# Patient Record
Sex: Female | Born: 1955 | Race: White | Hispanic: No | Smoking: Former smoker
Health system: Southern US, Community
[De-identification: ages and names within clinical notes are randomized; demographics above are authoritative.]

## PROBLEM LIST (undated history)

## (undated) DIAGNOSIS — M1990 Unspecified osteoarthritis, unspecified site: Secondary | ICD-10-CM

## (undated) DIAGNOSIS — I509 Heart failure, unspecified: Secondary | ICD-10-CM

## (undated) DIAGNOSIS — I10 Essential (primary) hypertension: Secondary | ICD-10-CM

## (undated) DIAGNOSIS — I639 Cerebral infarction, unspecified: Secondary | ICD-10-CM

## (undated) HISTORY — PX: REPLACEMENT TOTAL KNEE: SUR1224

## (undated) HISTORY — PX: CARPAL TUNNEL RELEASE: SHX101

## (undated) HISTORY — PX: CHOLECYSTECTOMY: SHX55

---

## 2004-09-08 ENCOUNTER — Inpatient Hospital Stay: Payer: Self-pay | Admitting: Internal Medicine

## 2004-09-08 ENCOUNTER — Other Ambulatory Visit: Payer: Self-pay

## 2004-09-22 ENCOUNTER — Encounter: Payer: Self-pay | Admitting: Internal Medicine

## 2004-10-02 ENCOUNTER — Encounter: Payer: Self-pay | Admitting: Internal Medicine

## 2004-11-01 ENCOUNTER — Encounter: Payer: Self-pay | Admitting: Internal Medicine

## 2004-12-02 ENCOUNTER — Encounter: Payer: Self-pay | Admitting: Internal Medicine

## 2005-01-02 ENCOUNTER — Encounter: Payer: Self-pay | Admitting: Internal Medicine

## 2005-02-01 ENCOUNTER — Encounter: Payer: Self-pay | Admitting: Internal Medicine

## 2006-06-04 ENCOUNTER — Ambulatory Visit: Payer: Self-pay | Admitting: Unknown Physician Specialty

## 2007-01-17 ENCOUNTER — Ambulatory Visit: Payer: Self-pay | Admitting: Unknown Physician Specialty

## 2007-01-21 ENCOUNTER — Inpatient Hospital Stay: Payer: Self-pay | Admitting: Unknown Physician Specialty

## 2007-08-18 ENCOUNTER — Ambulatory Visit: Payer: Self-pay | Admitting: Family Medicine

## 2007-09-21 ENCOUNTER — Ambulatory Visit: Payer: Self-pay | Admitting: Unknown Physician Specialty

## 2009-11-12 ENCOUNTER — Emergency Department: Payer: Self-pay | Admitting: Emergency Medicine

## 2010-03-13 ENCOUNTER — Emergency Department: Payer: Self-pay | Admitting: Emergency Medicine

## 2010-11-06 ENCOUNTER — Ambulatory Visit: Payer: Self-pay | Admitting: Internal Medicine

## 2011-03-18 ENCOUNTER — Emergency Department: Payer: Self-pay

## 2011-11-08 IMAGING — CR RIGHT HAND - COMPLETE 3+ VIEW
1 series · 3 of 3 positions shown · non-contrast
Comparison: none

REASON FOR EXAM: bilateral hand pain evaluate for any arthritic changes
COMMENTS:

[Series 1: view not recorded · 0.17mm/px · 3 of 3 slices shown]
[im 1/3]
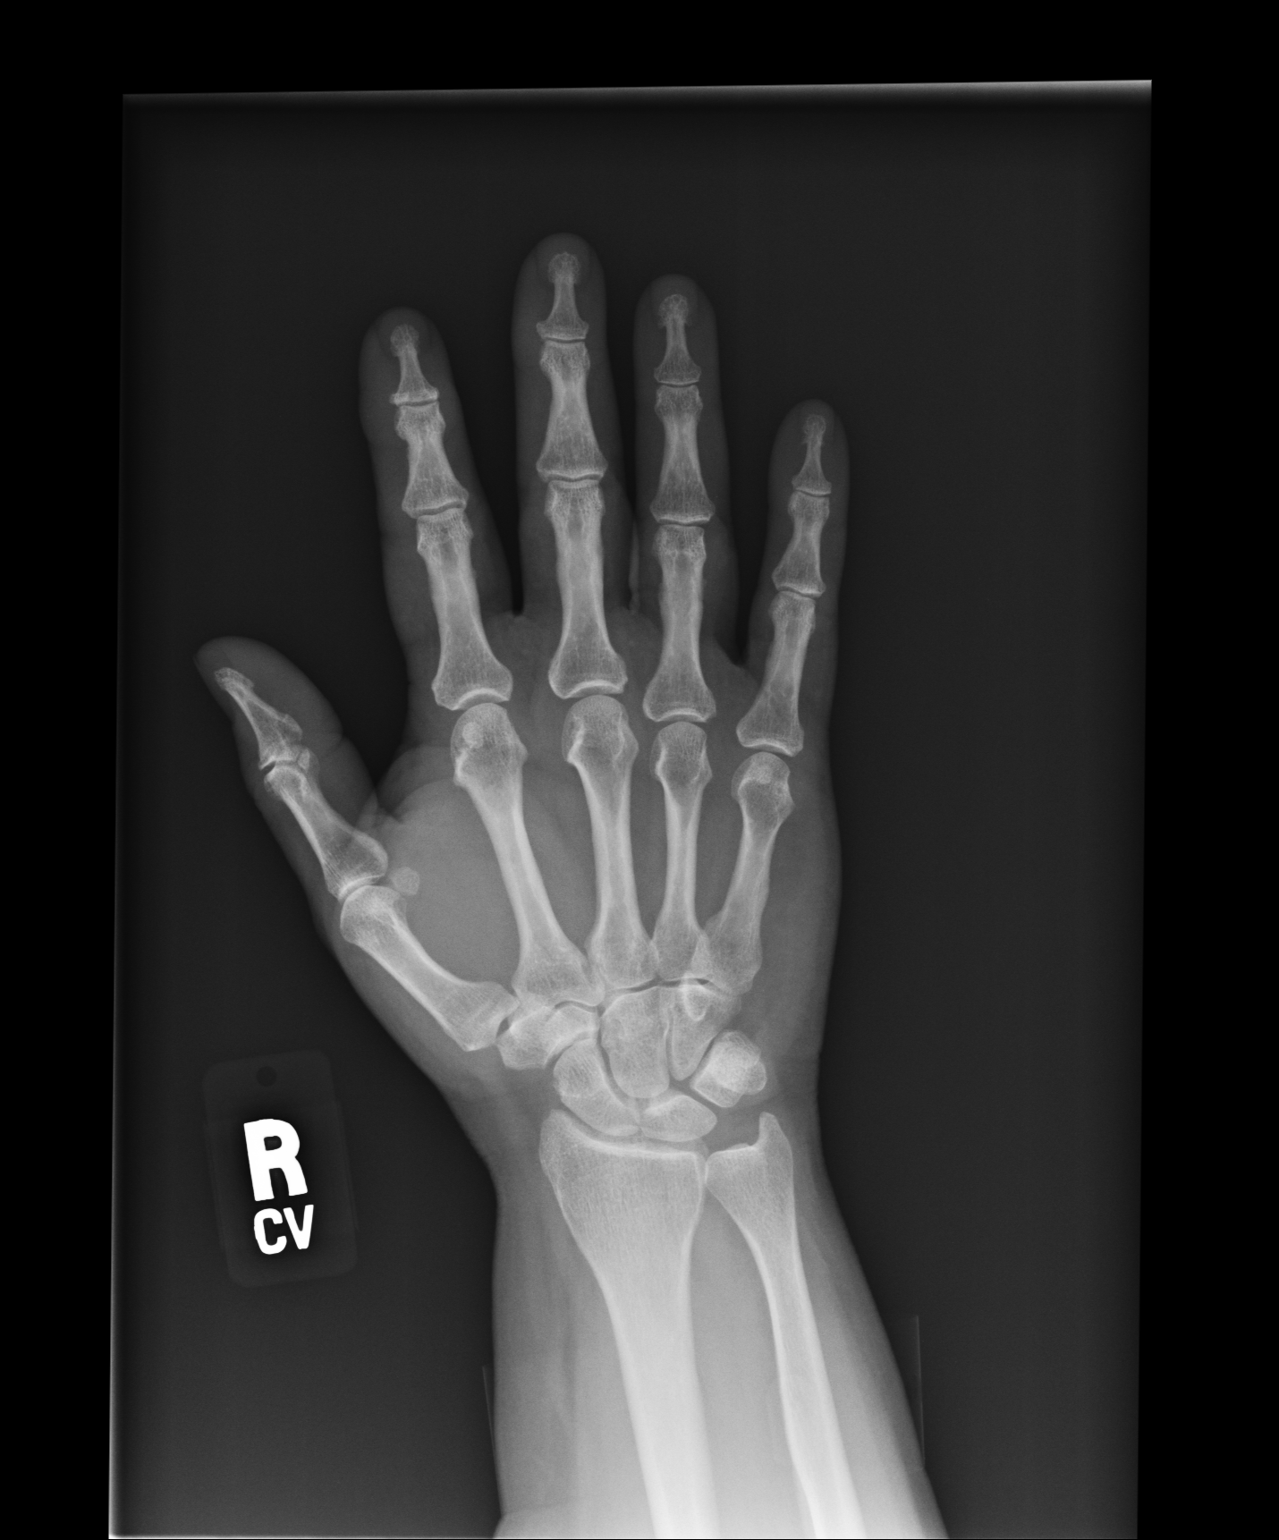
[im 2/3]
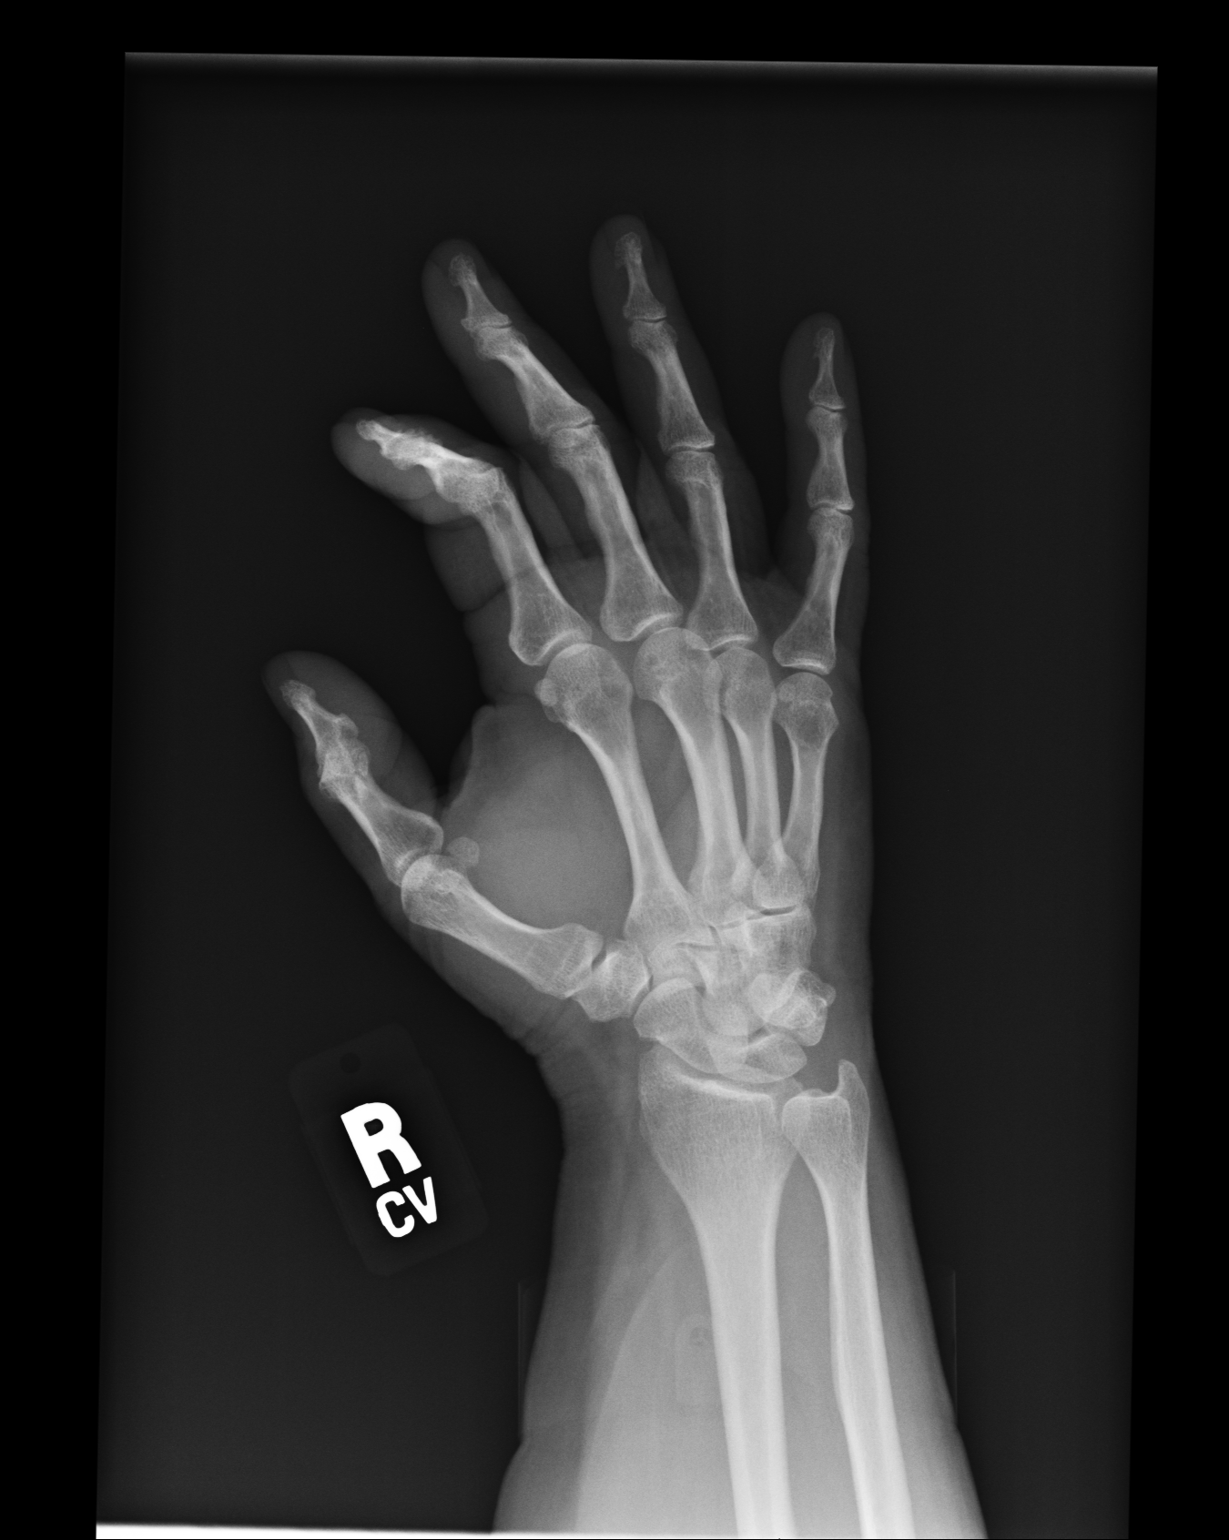
[im 3/3]
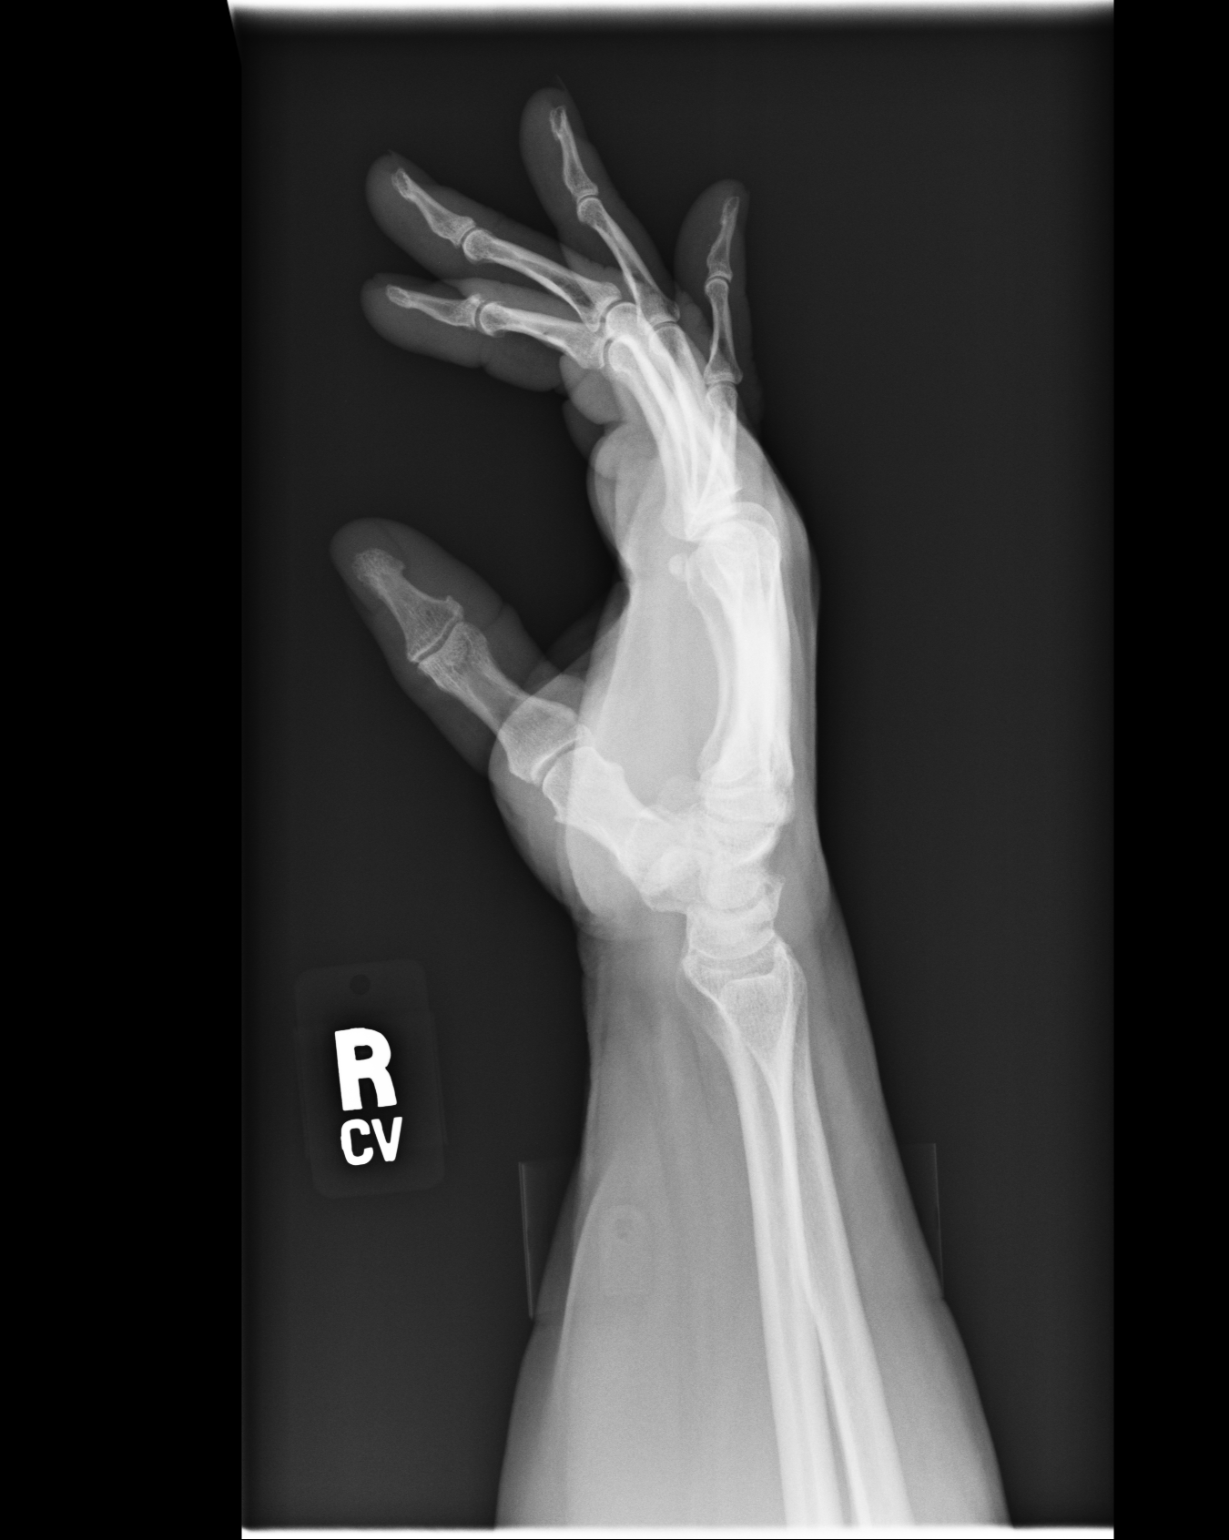

[3 of 3 positions shown; findings below may reference images not displayed]

PROCEDURE:     KDR - KDXR HAND RT COMPLETE W/OBLIQUES  - November 06, 2010 [DATE]

RESULT:     Images of the right hand demonstrate no definite fracture,
dislocation or radiopaque foreign body. Some mild degenerative changes are
seen predominately in the distal and phalangeal joints especially the second
and third digits with some minimal hypertrophic spurring. Very mild first
carpometacarpal degenerative change is evident.
IMPRESSION: No acute bony abnormality evident.

## 2011-11-08 IMAGING — CR DG HAND COMPLETE 3+V*L*
1 series · 4 of 4 positions shown · non-contrast
Comparison: none

REASON FOR EXAM: bilateral hand pain evaluate for any arthritic changes
COMMENTS:

PROCEDURE:     KDR - KDXR HAND LT COMPLETE W/OBLIQUES  - November 06, 2010 [DATE]
RESULT:     Is some minimal degenerative change of osteoarthritis in the
proximal inner phalangeal joint of the third digit. The bony structures
otherwise appear within normal limits.

[Series 1: view not recorded · 0.17mm/px · 4 of 4 slices shown]
[im 1/4]
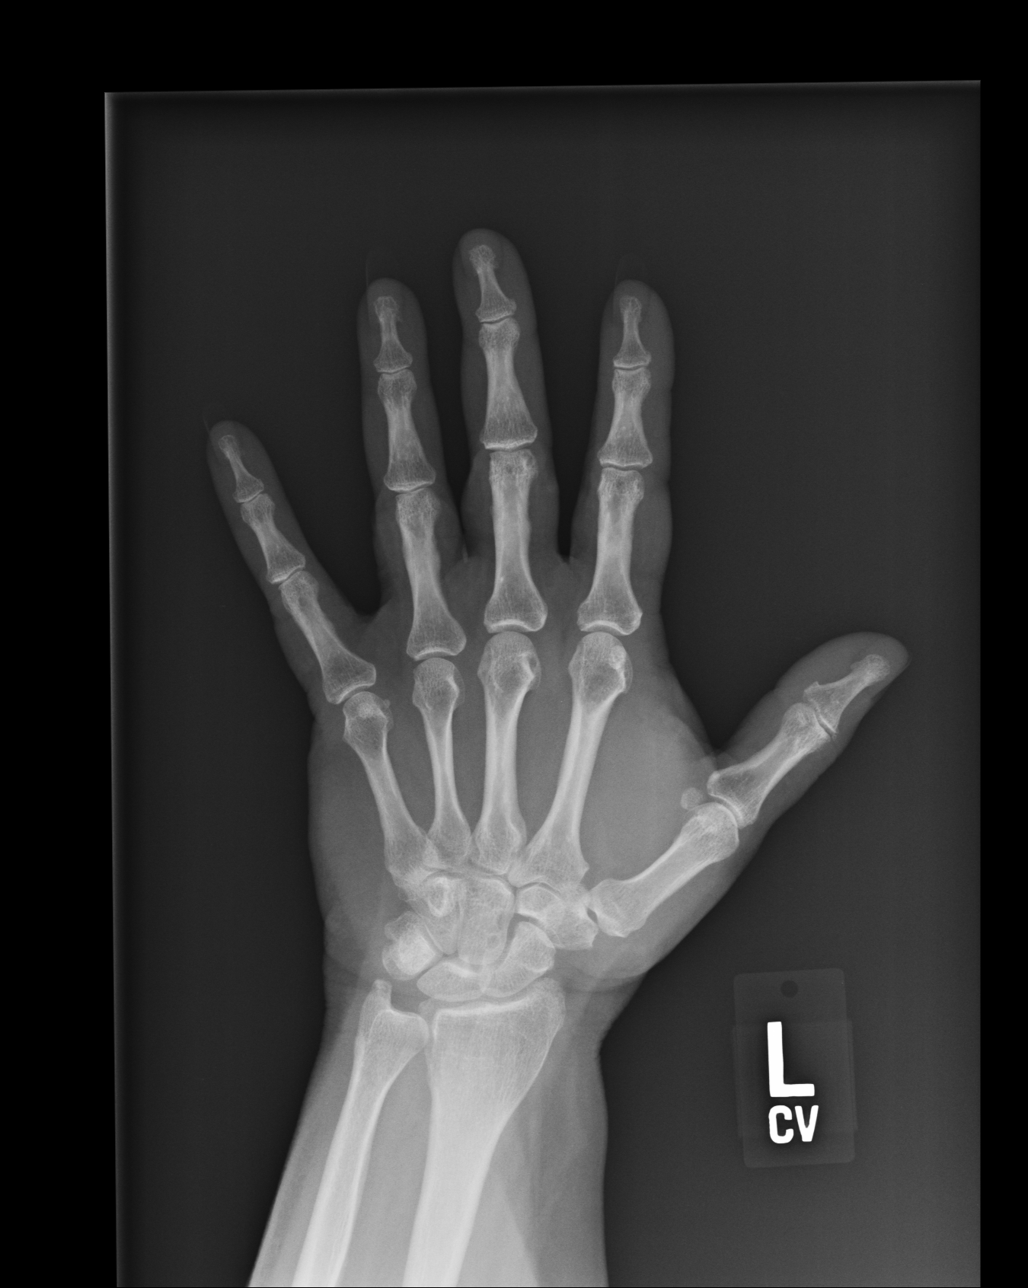
[im 2/4]
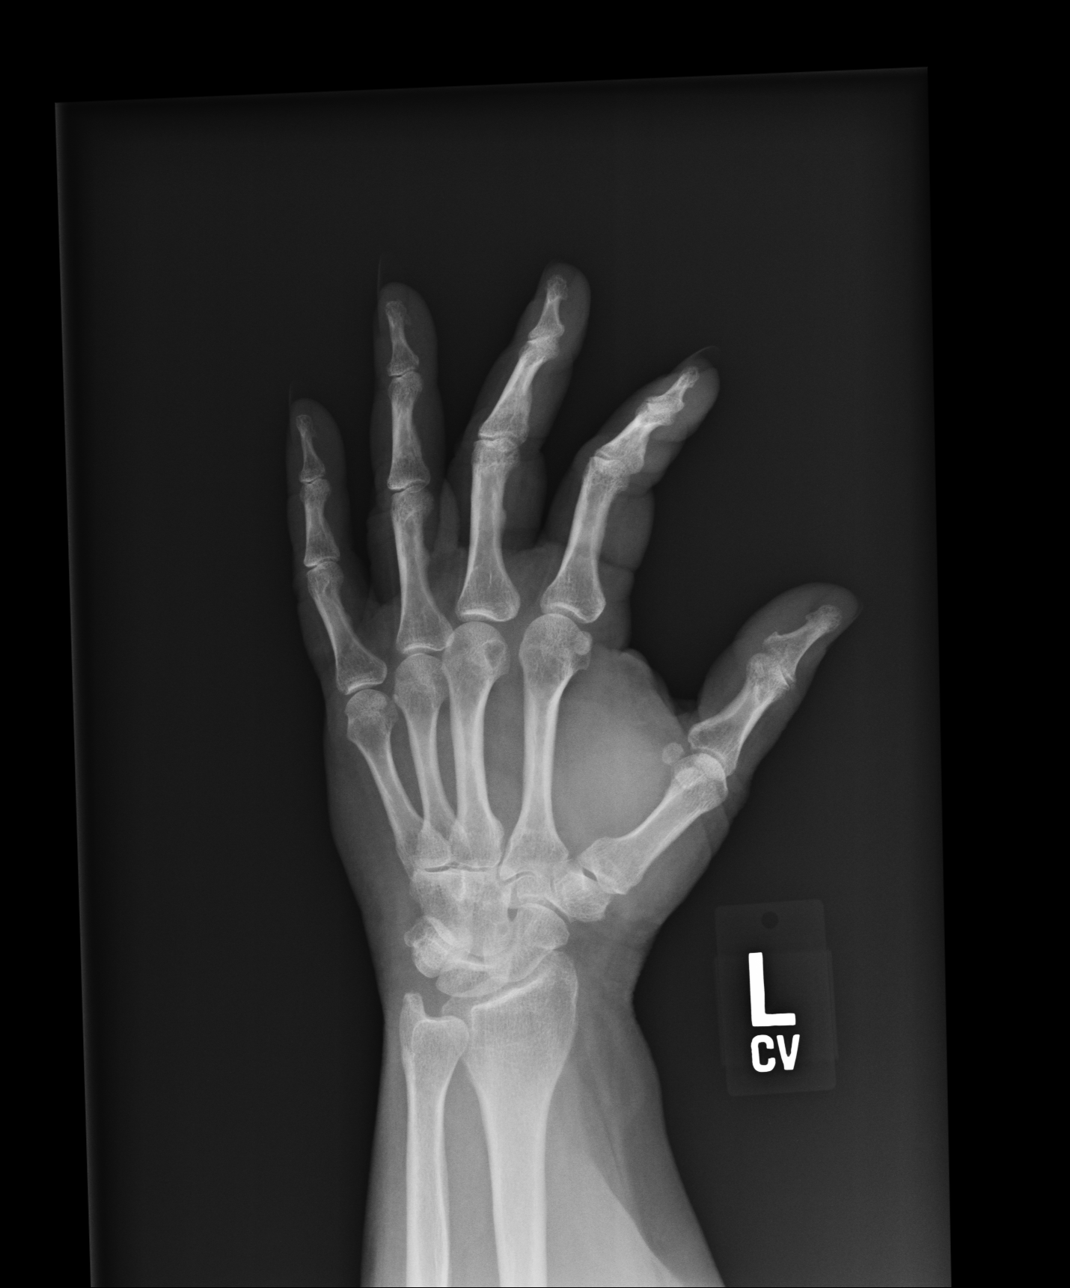
[im 3/4]
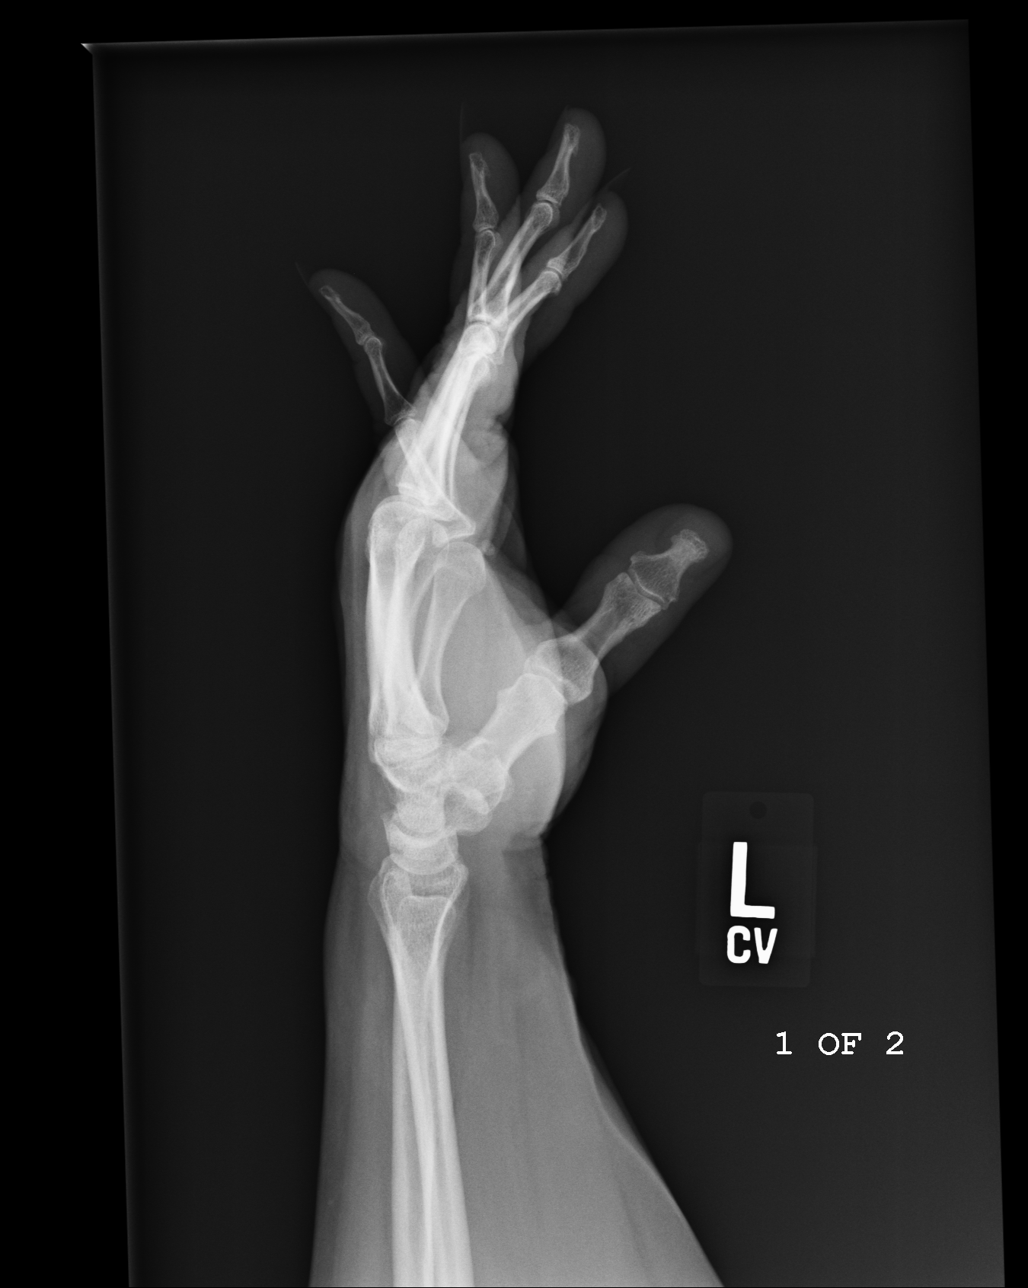
[im 4/4]
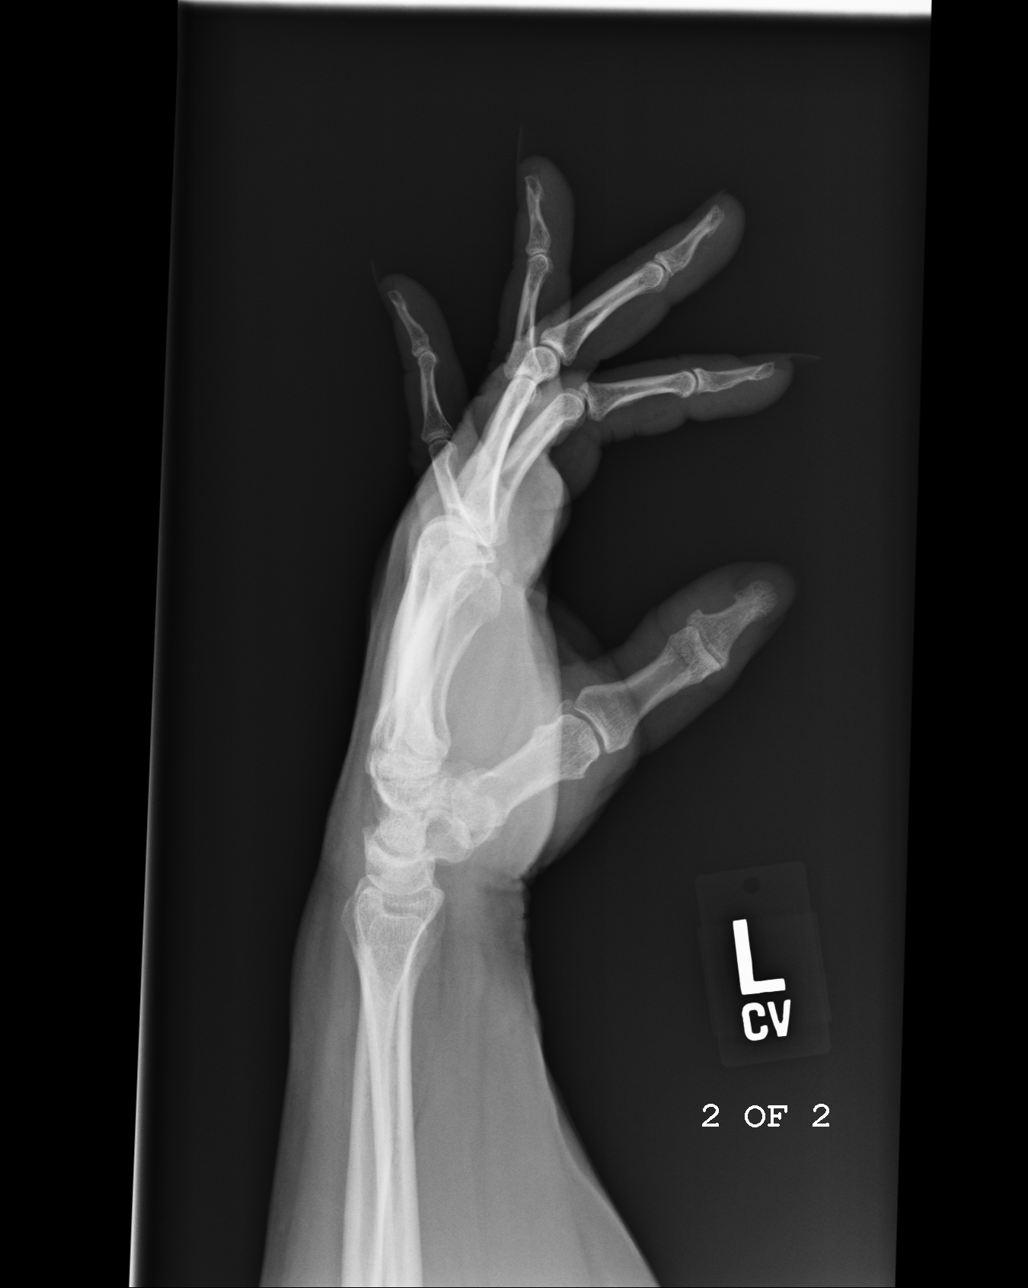

[4 of 4 positions shown; findings below may reference images not displayed]

IMPRESSION: Please see above.

## 2013-05-27 ENCOUNTER — Emergency Department: Payer: Self-pay | Admitting: Internal Medicine

## 2013-05-27 LAB — CBC
HCT: 40.7 % (ref 35.0–47.0)
HGB: 13.7 g/dL (ref 12.0–16.0)
MCH: 29.6 pg (ref 26.0–34.0)
MCHC: 33.7 g/dL (ref 32.0–36.0)
MCV: 88 fL (ref 80–100)
PLATELETS: 246 10*3/uL (ref 150–440)
RBC: 4.63 10*6/uL (ref 3.80–5.20)
RDW: 14.4 % (ref 11.5–14.5)
WBC: 11.6 10*3/uL — ABNORMAL HIGH (ref 3.6–11.0)

## 2013-05-27 LAB — URINALYSIS, COMPLETE
BACTERIA: NONE SEEN
BILIRUBIN, UR: NEGATIVE
GLUCOSE, UR: NEGATIVE mg/dL (ref 0–75)
Ketone: NEGATIVE
Leukocyte Esterase: NEGATIVE
Nitrite: NEGATIVE
PROTEIN: NEGATIVE
Ph: 6 (ref 4.5–8.0)
RBC,UR: 5 /HPF (ref 0–5)
SPECIFIC GRAVITY: 1.016 (ref 1.003–1.030)
WBC UR: 4 /HPF (ref 0–5)

## 2013-05-27 LAB — COMPREHENSIVE METABOLIC PANEL
ANION GAP: 2 — AB (ref 7–16)
Albumin: 3.7 g/dL (ref 3.4–5.0)
Alkaline Phosphatase: 95 U/L
BILIRUBIN TOTAL: 0.7 mg/dL (ref 0.2–1.0)
BUN: 8 mg/dL (ref 7–18)
CREATININE: 0.72 mg/dL (ref 0.60–1.30)
Calcium, Total: 9.7 mg/dL (ref 8.5–10.1)
Chloride: 103 mmol/L (ref 98–107)
Co2: 29 mmol/L (ref 21–32)
EGFR (African American): >60
EGFR (Non-African Amer.): >60
Glucose: 141 mg/dL — ABNORMAL HIGH (ref 65–99)
OSMOLALITY: 269 (ref 275–301)
Potassium: 3.8 mmol/L (ref 3.5–5.1)
SGOT(AST): 35 U/L (ref 15–37)
SGPT (ALT): 34 U/L (ref 12–78)
Sodium: 134 mmol/L — ABNORMAL LOW (ref 136–145)
TOTAL PROTEIN: 8 g/dL (ref 6.4–8.2)

## 2014-05-29 IMAGING — CT CT STONE STUDY
2 of 4 series · 16 of 46 positions shown, 18 images · non-contrast
Comparison: No priors.

CLINICAL DATA: Three days of hematuria and left flank pain.

EXAM:
CT ABDOMEN AND PELVIS WITHOUT CONTRAST
TECHNIQUE: Multidetector CT imaging of the abdomen and pelvis was performed
following the standard protocol without IV contrast.

[Series 2: stone standard full · axial · 0.75mm/px · z∈[-822,-422]mm · 13 of 88 slices shown, 15 images]
[im 4/88  soft-tissue]
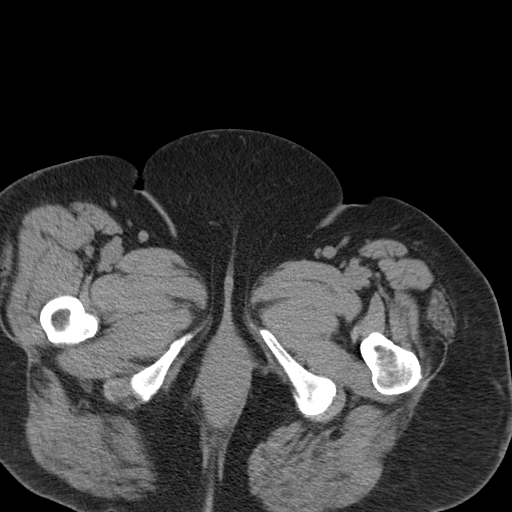
[im 4/88  bone]
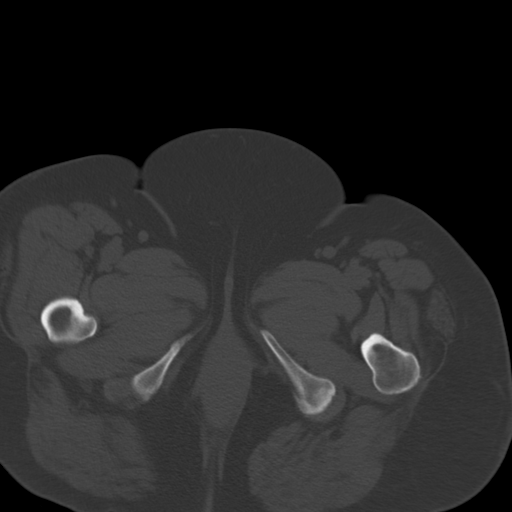
[im 11/88  soft-tissue]
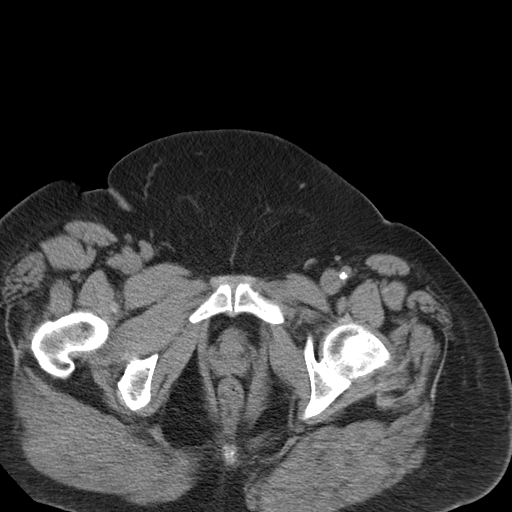
[im 19/88  soft-tissue]
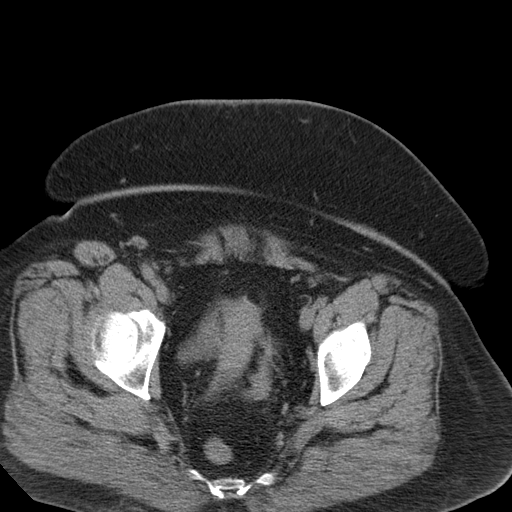
[im 26/88  soft-tissue]
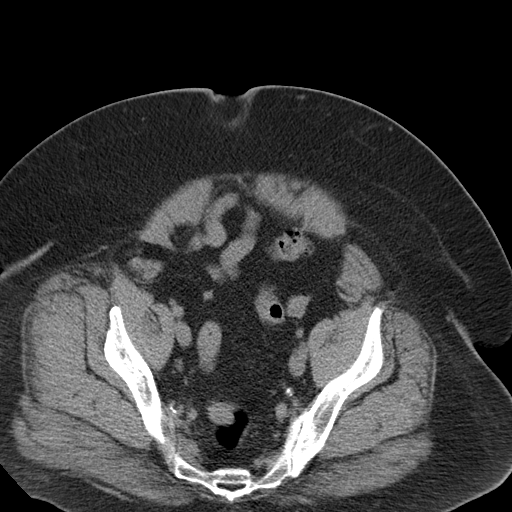
[im 30/88  soft-tissue]
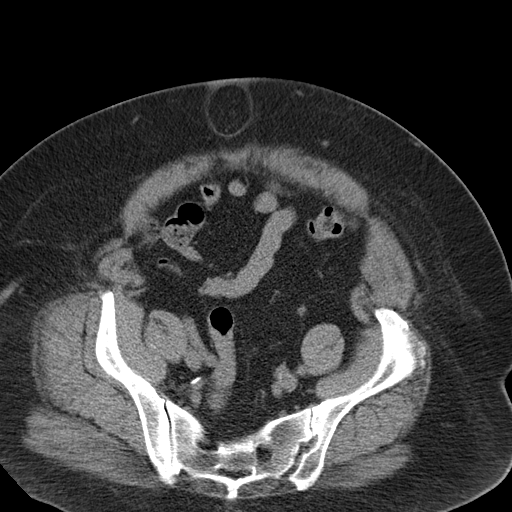
[im 37/88  soft-tissue]
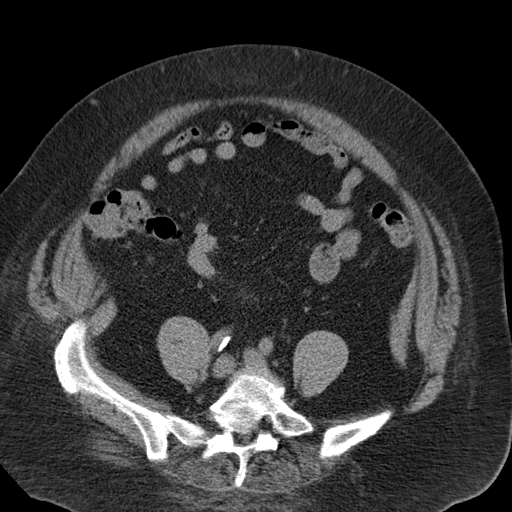
[im 44/88  soft-tissue]
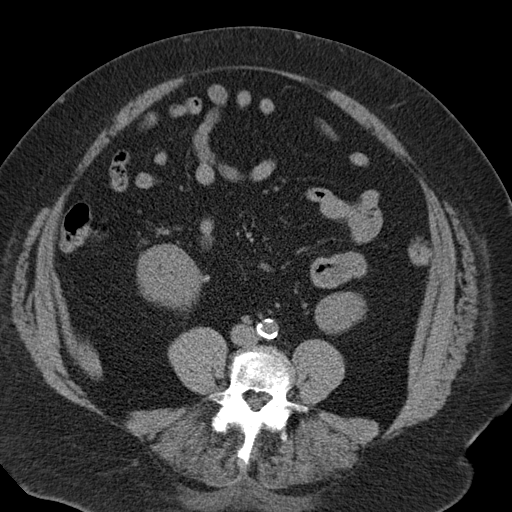
[im 51/88  soft-tissue]
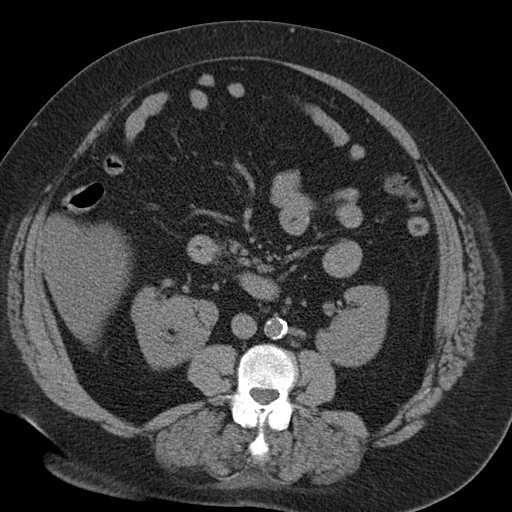
[im 59/88  soft-tissue]
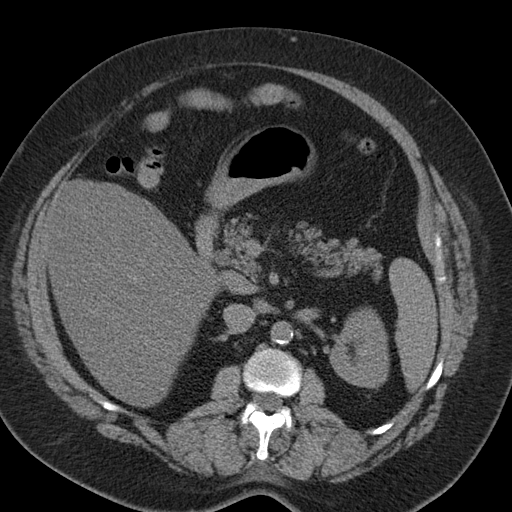
[im 59/88  bone]
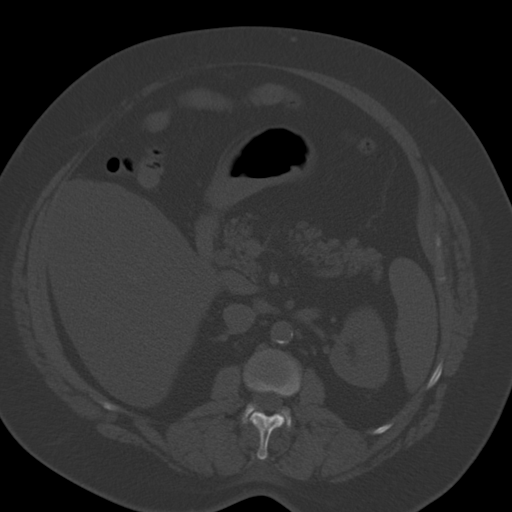
[im 62/88  soft-tissue]
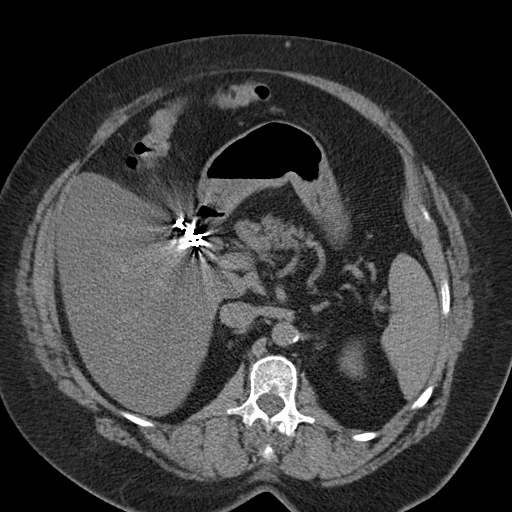
[im 69/88  soft-tissue]
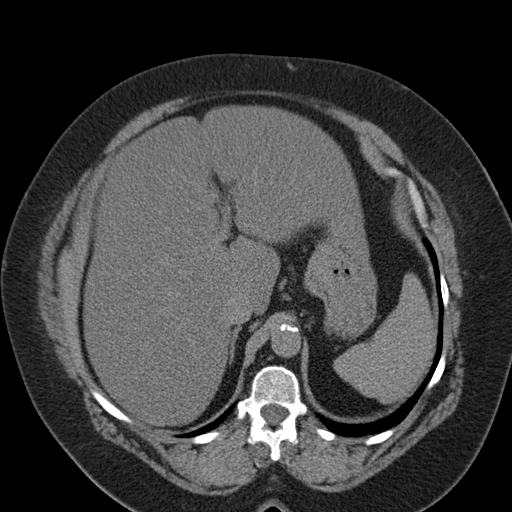
[im 77/88  soft-tissue]
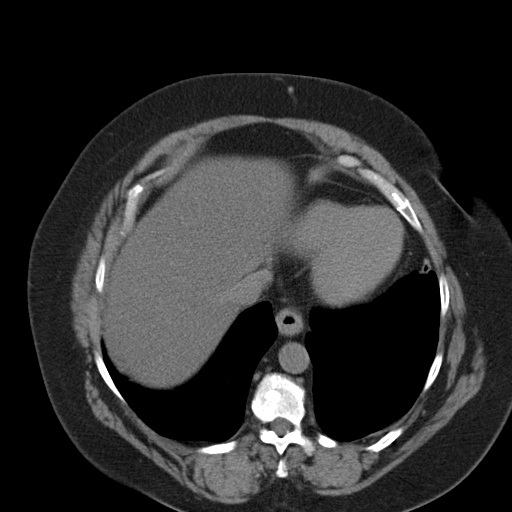
[im 84/88  soft-tissue]
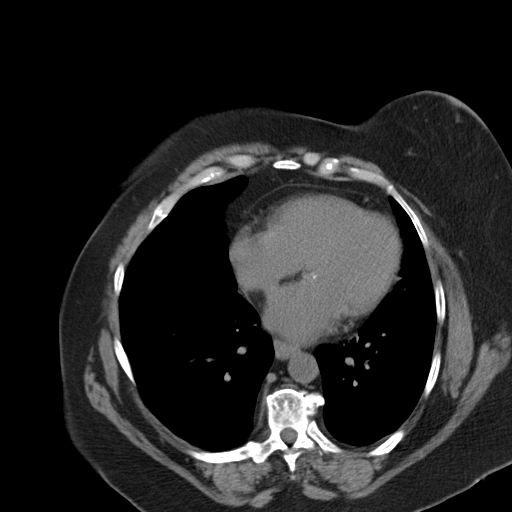

[Series 4: cor stone standard full · coronal · 0.80mm/px · 3 of 176 slices shown]
[im 59/176  soft-tissue]
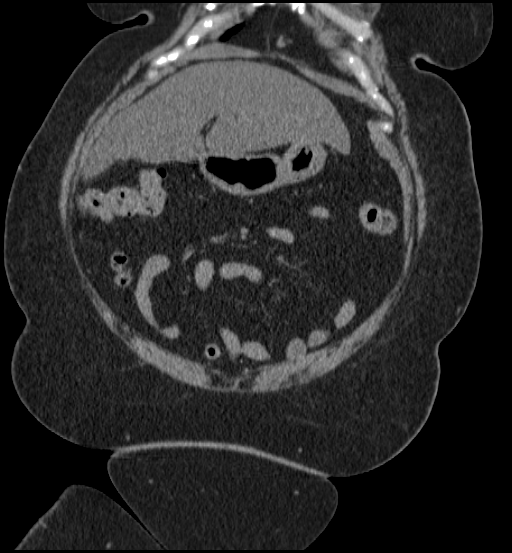
[im 78/176  soft-tissue]
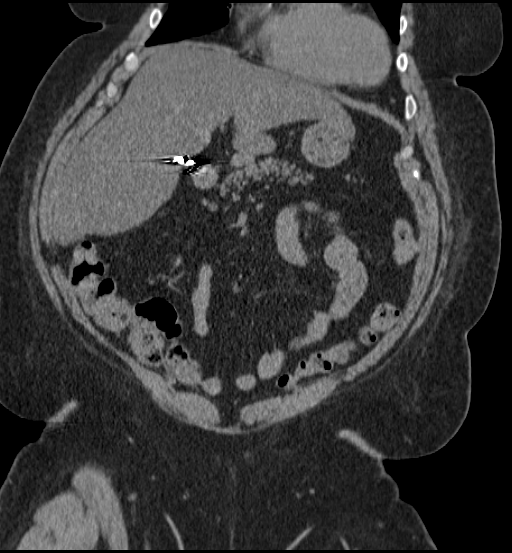
[im 98/176  soft-tissue]
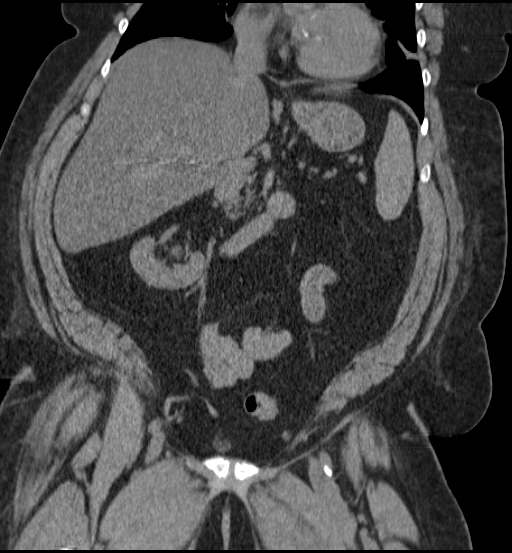

[16 of 46 positions shown; findings below may reference images not displayed]

FINDINGS: Lung Bases: Mild scarring in the inferior segment of the lingula.
Mild cardiomegaly. Calcifications of the aortic valve.
Atherosclerotic calcifications in the right coronary artery.

Abdomen/Pelvis: There are no abnormal calcifications within the
collecting system of either kidney, along the course of either
ureter, or within the lumen of the urinary bladder. No
hydroureteronephrosis or perinephric stranding to suggest urinary
tract obstruction at this time. The unenhanced appearance of the
kidneys is unremarkable bilaterally.

Diffuse low attenuation throughout the hepatic parenchyma,
compatible with hepatic steatosis. No definite focal hepatic lesions
are identified at this time. Status post cholecystectomy. The
unenhanced appearance of the pancreas, spleen and bilateral adrenal
glands is unremarkable. No significant volume of ascites. Normal
appendix. No pathologic distention of small bowel. No definite
lymphadenopathy identified within the abdomen or pelvis on today's
non contrast CT examination. Atherosclerosis throughout the
abdominal and pelvic vasculature, without definite aneurysm. Uterus
and ovaries are unremarkable in appearance on today's non contrast
CT examination. Urinary bladder is normal in appearance. Small
umbilical hernia containing some omental fat incidentally noted.

Musculoskeletal: There are no aggressive appearing lytic or blastic
lesions noted in the visualized portions of the skeleton.
IMPRESSION: 1. No acute findings in the abdomen or pelvis to account for the
patient's symptoms.
2. Specifically, no abnormal urinary tract calculi and no findings
to suggest urinary tract obstruction at this time.
3. Small umbilical hernia containing only omental fat incidentally
noted. No associated bowel incarceration or obstruction at this
time.
4. Hepatic steatosis.
5. Status post cholecystectomy.
6. Extensive atherosclerosis, including right coronary artery
disease. Please note that although the presence of coronary artery
calcium documents the presence of coronary artery disease, the
severity of this disease and any potential stenosis cannot be
assessed on this non-gated CT examination. Assessment for potential
risk factor modification, dietary therapy or pharmacologic therapy
may be warranted, if clinically indicated.

## 2015-08-02 ENCOUNTER — Other Ambulatory Visit: Payer: Self-pay | Admitting: Internal Medicine

## 2015-08-02 DIAGNOSIS — Z1231 Encounter for screening mammogram for malignant neoplasm of breast: Secondary | ICD-10-CM

## 2015-08-19 ENCOUNTER — Ambulatory Visit: Payer: Self-pay

## 2015-08-27 ENCOUNTER — Ambulatory Visit: Payer: Medicaid Other

## 2018-02-11 ENCOUNTER — Other Ambulatory Visit: Payer: Self-pay | Admitting: Internal Medicine

## 2018-02-11 DIAGNOSIS — Z1231 Encounter for screening mammogram for malignant neoplasm of breast: Secondary | ICD-10-CM

## 2020-01-28 ENCOUNTER — Emergency Department
Admission: EM | Admit: 2020-01-28 | Discharge: 2020-01-28 | Disposition: A | Discharge status: Home / Self Care | Payer: Medicare HMO | Attending: Emergency Medicine | Admitting: Emergency Medicine

## 2020-01-28 ENCOUNTER — Other Ambulatory Visit: Payer: Self-pay

## 2020-01-28 ENCOUNTER — Emergency Department: Payer: Medicare HMO

## 2020-01-28 DIAGNOSIS — I509 Heart failure, unspecified: Secondary | ICD-10-CM | POA: Diagnosis not present

## 2020-01-28 DIAGNOSIS — Z87891 Personal history of nicotine dependence: Secondary | ICD-10-CM | POA: Insufficient documentation

## 2020-01-28 DIAGNOSIS — I110 Hypertensive heart disease with heart failure: Secondary | ICD-10-CM | POA: Insufficient documentation

## 2020-01-28 DIAGNOSIS — Z96659 Presence of unspecified artificial knee joint: Secondary | ICD-10-CM | POA: Insufficient documentation

## 2020-01-28 DIAGNOSIS — U071 COVID-19: Secondary | ICD-10-CM | POA: Diagnosis not present

## 2020-01-28 DIAGNOSIS — R0602 Shortness of breath: Secondary | ICD-10-CM | POA: Diagnosis present

## 2020-01-28 HISTORY — DX: Heart failure, unspecified: I50.9

## 2020-01-28 HISTORY — DX: Unspecified osteoarthritis, unspecified site: M19.90

## 2020-01-28 HISTORY — DX: Cerebral infarction, unspecified: I63.9

## 2020-01-28 HISTORY — DX: Essential (primary) hypertension: I10

## 2020-01-28 LAB — COMPREHENSIVE METABOLIC PANEL
ALT: 23 U/L (ref 0–44)
AST: 26 U/L (ref 15–41)
Albumin: 4.1 g/dL (ref 3.5–5.0)
Alkaline Phosphatase: 80 U/L (ref 38–126)
Anion gap: 8 (ref 5–15)
BUN: 10 mg/dL (ref 8–23)
CO2: 28 mmol/L (ref 22–32)
Calcium: 9.6 mg/dL (ref 8.9–10.3)
Chloride: 103 mmol/L (ref 98–111)
Creatinine, Ser: 0.63 mg/dL (ref 0.44–1.00)
GFR calc Af Amer: >60 mL/min (ref >60)
GFR calc non Af Amer: >60 mL/min (ref >60)
Glucose, Bld: 86 mg/dL (ref 70–99)
Potassium: 3.9 mmol/L (ref 3.5–5.1)
Sodium: 139 mmol/L (ref 135–145)
Total Bilirubin: 0.8 mg/dL (ref 0.3–1.2)
Total Protein: 8.3 g/dL — ABNORMAL HIGH (ref 6.5–8.1)

## 2020-01-28 LAB — CBC WITH DIFFERENTIAL/PLATELET
Abs Immature Granulocytes: 0.02 10*3/uL (ref 0.00–0.07)
Basophils Absolute: 0 10*3/uL (ref 0.0–0.1)
Basophils Relative: 0 %
Eosinophils Absolute: 0.2 10*3/uL (ref 0.0–0.5)
Eosinophils Relative: 3 %
HCT: 41.2 % (ref 36.0–46.0)
Hemoglobin: 14.5 g/dL (ref 12.0–15.0)
Immature Granulocytes: 0 %
Lymphocytes Relative: 34 %
Lymphs Abs: 2.5 10*3/uL (ref 0.7–4.0)
MCH: 30.9 pg (ref 26.0–34.0)
MCHC: 35.2 g/dL (ref 30.0–36.0)
MCV: 87.7 fL (ref 80.0–100.0)
Monocytes Absolute: 0.8 10*3/uL (ref 0.1–1.0)
Monocytes Relative: 11 %
Neutro Abs: 3.7 10*3/uL (ref 1.7–7.7)
Neutrophils Relative %: 52 %
Platelets: 222 10*3/uL (ref 150–400)
RBC: 4.7 MIL/uL (ref 3.87–5.11)
RDW: 13.1 % (ref 11.5–15.5)
WBC: 7.2 10*3/uL (ref 4.0–10.5)
nRBC: 0 % (ref 0.0–0.2)

## 2020-01-28 LAB — BRAIN NATRIURETIC PEPTIDE: B Natriuretic Peptide: 47.6 pg/mL (ref 0.0–100.0)

## 2020-01-28 LAB — TROPONIN I (HIGH SENSITIVITY): Troponin I (High Sensitivity): 5 ng/L (ref <18)

## 2020-01-28 LAB — RESPIRATORY PANEL BY RT PCR (FLU A&B, COVID)
Influenza A by PCR: NEGATIVE
Influenza B by PCR: NEGATIVE
SARS Coronavirus 2 by RT PCR: POSITIVE — AB

## 2020-01-28 MED ORDER — SPACER/AERO-HOLDING CHAMBERS DEVI: 1.0000 | Freq: Once | 0 refills | Status: AC

## 2020-01-28 MED ORDER — BENZONATATE 100 MG PO CAPS: 100.0000 mg | ORAL_CAPSULE | Freq: Three times a day (TID) | ORAL | 0 refills | Status: AC | PRN

## 2020-01-28 MED ORDER — ALBUTEROL SULFATE HFA 108 (90 BASE) MCG/ACT IN AERS: 2.0000 | INHALATION_SPRAY | Freq: Four times a day (QID) | RESPIRATORY_TRACT | 2 refills | Status: AC | PRN

## 2020-01-28 NOTE — ED Provider Notes (Signed)
[facility] Emergency Department Provider Note  ____________________________________________   First MD Initiated Contact with Patient 01/28/20 1537     (approximate)  I have reviewed the triage vital signs and the nursing notes.   HISTORY  Chief Complaint Covid Exposure  HPI Shelby Conway is a 64 y.o. female who presents to the emergency department for evaluation shortness of breath, cough for the last 6 days.  The patient was around her granddaughter who recently tested positive for Covid.  Her largest complaint is orthopnea, and in the interim she is sleeping in a recliner.  She has a productive cough with yellow drainage.  She believes that she had a fever the first 3 days of her illness but has not had a fever since then.  She also states that she had a headache the first few days, but has not had that recently either.  She denies abdominal pain, denies vomiting or diarrhea.  She denies any lower extremity swelling.  Of note, there is a note of congestive heart failure history in her chart, but the patient is unaware of this diagnosis.  She states that she only takes medicine for hypertension and high blood pressure as well as a blood thinner for stroke prevention.         Past Medical History:  Diagnosis Date  . Arthritis   . CHF (congestive heart failure) (HCC)   . Hypertension   . Stroke [facility])     There are no problems to display for this patient.   Past Surgical History:  Procedure Laterality Date  . CARPAL TUNNEL RELEASE    . CESAREAN SECTION    . CHOLECYSTECTOMY    . REPLACEMENT TOTAL KNEE      Prior to Admission medications   Medication Sig Start Date End Date Taking? Authorizing Provider  albuterol (VENTOLIN HFA) 108 (90 Base) MCG/ACT inhaler Inhale 2 puffs into the lungs every 6 (six) hours as needed for wheezing or shortness of breath. 01/28/20   Lucy Chris, PA  benzonatate (TESSALON PERLES) 100 MG capsule Take 1 capsule  (100 mg total) by mouth 3 (three) times daily as needed for cough. 01/28/20 02/27/20  Lucy Chris, PA  Spacer/Aero-Holding Chambers DEVI 1 Device by Does not apply route once for 1 dose. 01/28/20 01/28/20  Lucy Chris, PA    Allergies Vicodin [hydrocodone-acetaminophen]  No family history on file.  Social History Social History   Tobacco Use  . Smoking status: Former Games developer  . Smokeless tobacco: Never Used  Substance Use Topics  . Alcohol use: Never  . Drug use: Never    Review of Systems Constitutional: + fever/chills Eyes: No visual changes. ENT: No sore throat. Cardiovascular: Denies chest pain. Respiratory: + cough, + SOB, + orthopnea Gastrointestinal: No abdominal pain.  No nausea, no vomiting.  No diarrhea.  No constipation. Genitourinary: Negative for dysuria. Musculoskeletal: Negative for back pain. Skin: Negative for rash. Neurological: Negative for headaches, focal weakness or numbness.  ____________________________________________   PHYSICAL EXAM:  VITAL SIGNS: ED Triage Vitals [01/28/20 1424]  Enc Vitals Group     BP (!) 173/72     Pulse Rate 81     Resp (!) 22     Temp 98.3 F (36.8 C)     Temp Source Oral     SpO2 95 %     Weight 215 lb (97.5 kg)     Height 5\' 2"  (1.575 m)     Head Circumference  Peak Flow      Pain Score 10     Pain Loc      Pain Edu?      Excl. in GC?    Constitutional: Alert and oriented.  Sitting forward to breathe and is unable to lie back. Eyes: Conjunctivae are normal. PERRL. EOMI. Head: Atraumatic. Nose: + congestion/rhinnorhea. Mouth/Throat: Mucous membranes are moist.  Oropharynx non-erythematous. Neck: No stridor.  Cardiovascular: Normal rate, regular rhythm. Grossly normal heart sounds.  Good peripheral circulation.  No lower extremity edema present. Respiratory: Normal respiratory effort.  No retractions.  Crackles are heard throughout lung fields bilaterally.  Gastrointestinal: Soft and  nontender. No distention. No abdominal bruits. No CVA tenderness. Musculoskeletal: No lower extremity tenderness nor edema.  No joint effusions. Neurologic:  Normal speech and language. No gross focal neurologic deficits are appreciated. No gait instability. Skin:  Skin is warm, dry and intact. No rash noted. Psychiatric: Mood and affect are normal. Speech and behavior are normal.  ____________________________________________   LABS (all labs ordered are listed, but only abnormal results are displayed)  Labs Reviewed  RESPIRATORY PANEL BY RT PCR (FLU A&B, COVID) - Abnormal; Notable for the following components:      Result Value   SARS Coronavirus 2 by RT PCR POSITIVE (*)    All other components within normal limits  COMPREHENSIVE METABOLIC PANEL - Abnormal; Notable for the following components:   Total Protein 8.3 (*)    All other components within normal limits  CBC WITH DIFFERENTIAL/PLATELET  BRAIN NATRIURETIC PEPTIDE  TROPONIN I (HIGH SENSITIVITY)  TROPONIN I (HIGH SENSITIVITY)   ____________________________________________  EKG  EKG demonstrates some artifact noted throughout.  There appears to be normal sinus rhythm with a rate in the 70s.  There are no ST-T wave changes, no T wave inversions.  No evidence of acute cardiac findings. ____________________________________________  RADIOLOGY    Official radiology report(s): DG Chest Portable 1 View  Result Date: 01/28/2020 CLINICAL DATA:  Shortness of breath. Cough. COVID exposure 1 week ago. EXAM: PORTABLE CHEST 1 VIEW COMPARISON:  Remote chest radiograph 01/17/2007 FINDINGS: Upper normal heart size. Normal mediastinal contours. Mild interstitial coarsening without focal airspace disease. No pleural effusion or pneumothorax. Remote left upper rib fracture. No acute osseous abnormalities are seen. IMPRESSION: Upper normal heart size. Mild interstitial coarsening without focal airspace disease. Considerations include atypical  infection or pulmonary edema. Electronically Signed   By: Narda Rutherford M.D.   On: 01/28/2020 16:15   ____________________________________________   INITIAL IMPRESSION / ASSESSMENT AND PLAN / ED COURSE  As part of my medical decision making, I reviewed the following data within the electronic MEDICAL RECORD NUMBER Nursing notes reviewed and incorporated        Shelby Conway is a 64 year old female who presents to the emergency department for evaluation of shortness of breath following positive Covid exposure from her granddaughter.  The patient does have a history in her chart of congestive heart failure, however she is unaware of this diagnosis.  Physical exam demonstrates a patient that is sitting forward to breathe, and crackles are heard throughout the bilateral lungs.  Vitals demonstrate an increased respiration rate with SPO2 of 95.  The patient was ambulated by the nursing team and she was able to maintain SPO2 greater than 95%.  Laboratory evaluation includes a normal troponin, normal CBC, relatively normal CMP, and normal BNP.  Chest x-ray is significant for interstitial thickening and could represent Covid versus pulmonary edema.  Her Covid test is  positive.  At this time, feel that this is likely to favor Covid versus congestive heart failure as she does not have any peripheral edema and a normal BNP.  Did recommend that she should follow-up with her primary care doctor and clarify this diagnosis as well as receive treatment for that.  At this time, we will treat her Covid with an albuterol inhaler as well as Jerilynn Som for her cough.  Recommended that the patient return to the emergency department if she has any worsening of symptoms or if she develops any swelling of her legs or feet.  The patient is in agreement with this plan and will follow up with outpatient therapy.        ____________________________________________   FINAL CLINICAL IMPRESSION(S) / ED DIAGNOSES  Final  diagnoses:  COVID-19     ED Discharge Orders         Ordered    albuterol (VENTOLIN HFA) 108 (90 Base) MCG/ACT inhaler  Every 6 hours PRN        01/28/20 1839    Spacer/Aero-Holding Chambers DEVI   Once        01/28/20 1839    benzonatate (TESSALON PERLES) 100 MG capsule  3 times daily PRN        01/28/20 1839          *Please note:  Shelby Conway was evaluated in Emergency Department on 01/28/2020 for the symptoms described in the history of present illness. She was evaluated in the context of the global COVID-19 pandemic, which necessitated consideration that the patient might be at risk for infection with the SARS-CoV-2 virus that causes COVID-19. Institutional protocols and algorithms that pertain to the evaluation of patients at risk for COVID-19 are in a state of rapid change based on information released by regulatory bodies including the CDC and federal and state organizations. These policies and algorithms were followed during the patient's care in the ED.  Some ED evaluations and interventions may be delayed as a result of limited staffing during and the pandemic.*   Note:  This document was prepared using Dragon voice recognition software and may include unintentional dictation errors.    Lucy Chris, PA 01/28/20 1859    Merwyn Katos, MD 01/28/20 [phone]

## 2020-01-28 NOTE — ED Notes (Signed)
Oxygen remains above 97% with ambulation

## 2020-01-28 NOTE — ED Triage Notes (Signed)
Pt to the er for possible covid. Known exposure 1 week ago. Pt has sob, sinus drainage, cough, no fever.

## 2020-01-29 ENCOUNTER — Telehealth (HOSPITAL_COMMUNITY): Payer: Self-pay | Admitting: Adult Health

## 2020-01-29 NOTE — Telephone Encounter (Signed)
Called patient regarding monoclonal antibody treatment for COVID 19 given to those who are at risk for complications and/or hospitalization of the virus.  Patient meets criteria based on: age and heart issues.  Phone number on file is not working.  Unable to get through, she does not have mychart.      Shelby Anes, NP

## 2021-01-29 IMAGING — DX DG CHEST 1V PORT
1 series · 1 of 1 positions shown · non-contrast
Comparison: Remote chest radiograph 01/17/2007

CLINICAL DATA: Shortness of breath. Cough. COVID exposure 1 week
ago.

EXAM:
PORTABLE CHEST 1 VIEW

[chest ap]
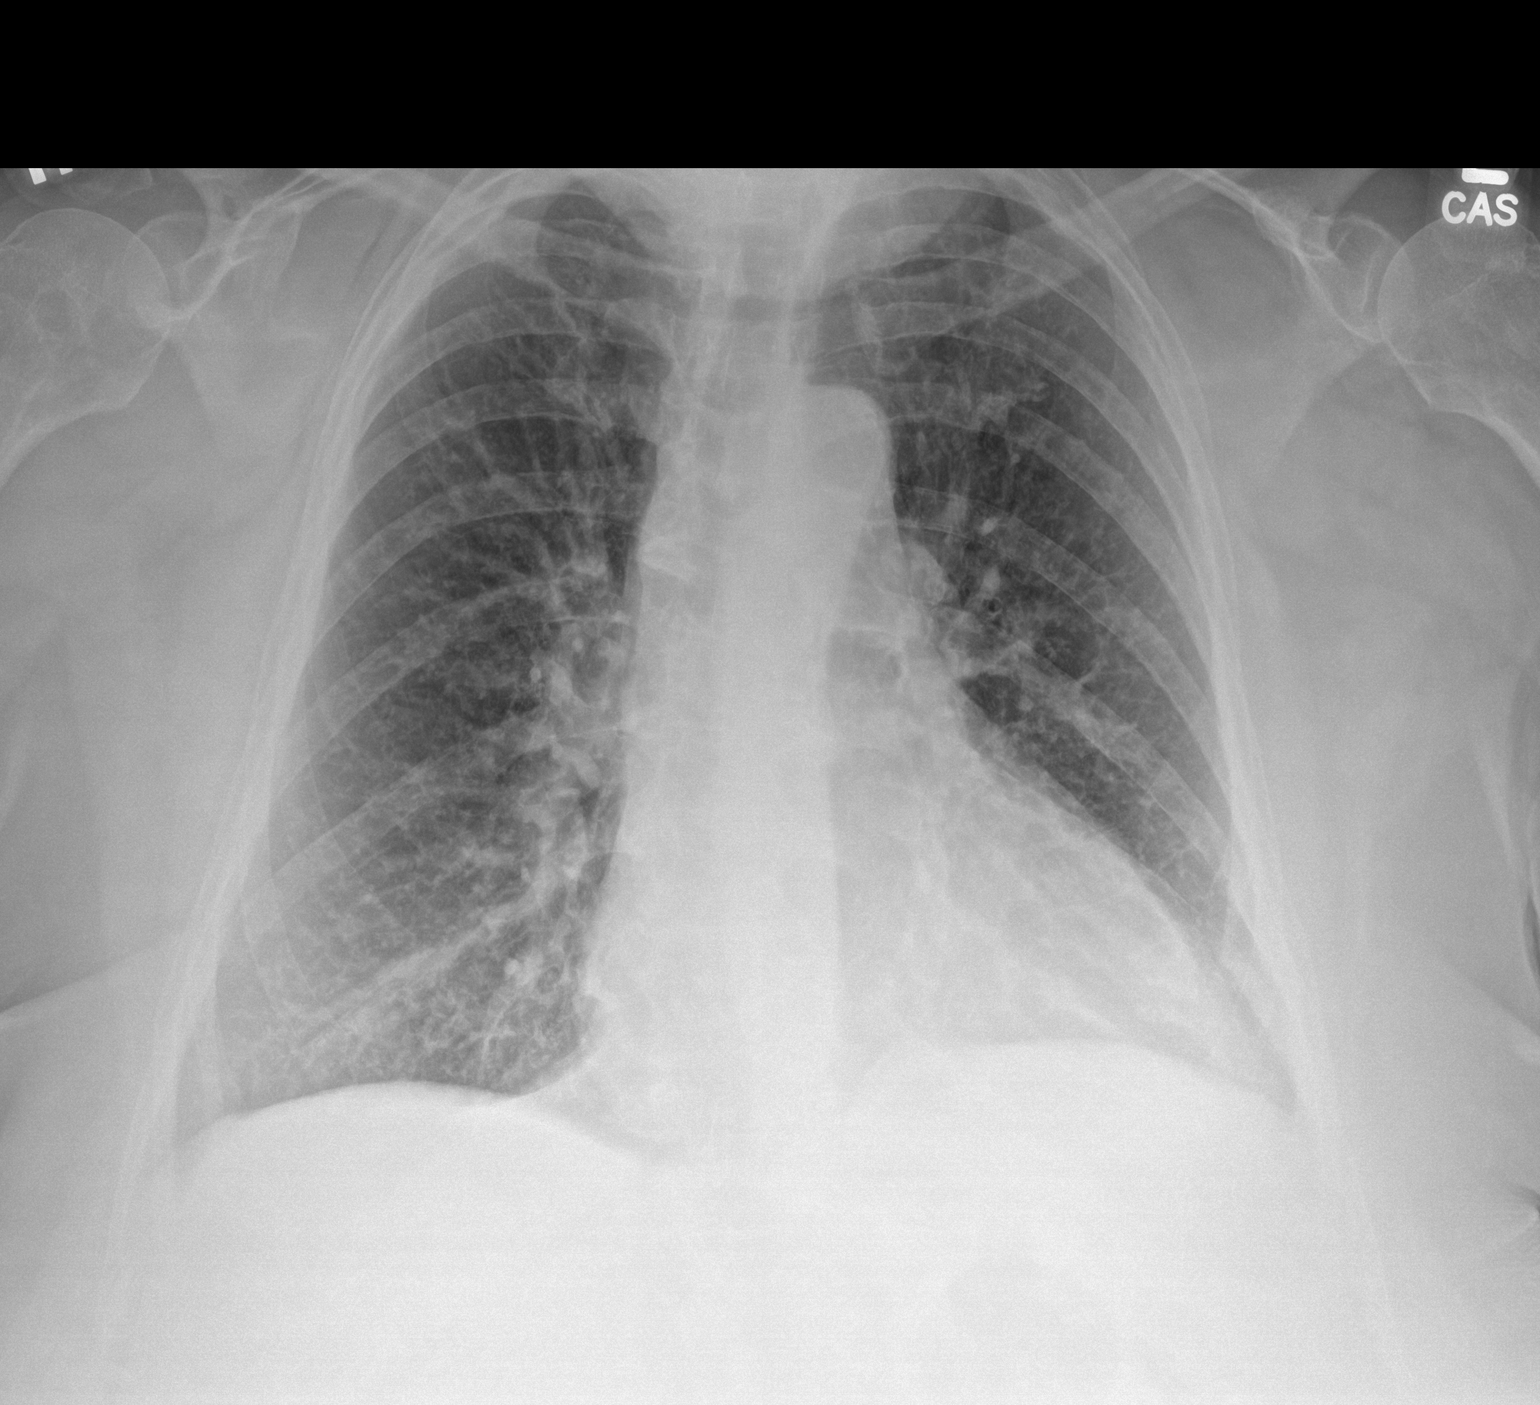

[1 of 1 positions shown; findings below may reference images not displayed]

FINDINGS: Upper normal heart size. Normal mediastinal contours. Mild
interstitial coarsening without focal airspace disease. No pleural
effusion or pneumothorax. Remote left upper rib fracture. No acute
osseous abnormalities are seen.
IMPRESSION: Upper normal heart size. Mild interstitial coarsening without focal
airspace disease. Considerations include atypical infection or
pulmonary edema.

## 2021-06-26 ENCOUNTER — Other Ambulatory Visit: Payer: Self-pay | Admitting: Nurse Practitioner

## 2021-06-26 DIAGNOSIS — M545 Low back pain, unspecified: Secondary | ICD-10-CM

## 2021-06-26 DIAGNOSIS — M542 Cervicalgia: Secondary | ICD-10-CM

## 2021-07-04 ENCOUNTER — Ambulatory Visit: Admission: RE | Admit: 2021-07-04 | Payer: Medicare HMO | Source: Ambulatory Visit

## 2021-07-04 ENCOUNTER — Ambulatory Visit: Payer: Medicare HMO

## 2021-07-29 ENCOUNTER — Ambulatory Visit: Payer: Medicare HMO

## 2021-07-29 ENCOUNTER — Inpatient Hospital Stay: Admission: RE | Admit: 2021-07-29 | Payer: Medicare HMO | Source: Ambulatory Visit

## 2023-09-02 ENCOUNTER — Emergency Department

## 2023-09-02 ENCOUNTER — Inpatient Hospital Stay
Admission: EM | Admit: 2023-09-02 | Discharge: 2023-09-10 | DRG: 871 | Disposition: A | Discharge status: Home Health | Attending: Internal Medicine | Admitting: Internal Medicine

## 2023-09-02 ENCOUNTER — Other Ambulatory Visit: Payer: Self-pay

## 2023-09-02 DIAGNOSIS — Z8673 Personal history of transient ischemic attack (TIA), and cerebral infarction without residual deficits: Secondary | ICD-10-CM

## 2023-09-02 DIAGNOSIS — I110 Hypertensive heart disease with heart failure: Secondary | ICD-10-CM | POA: Diagnosis present

## 2023-09-02 DIAGNOSIS — E1100 Type 2 diabetes mellitus with hyperosmolarity without nonketotic hyperglycemic-hyperosmolar coma (NKHHC): Secondary | ICD-10-CM | POA: Diagnosis present

## 2023-09-02 DIAGNOSIS — E860 Dehydration: Secondary | ICD-10-CM | POA: Diagnosis present

## 2023-09-02 DIAGNOSIS — E785 Hyperlipidemia, unspecified: Secondary | ICD-10-CM | POA: Diagnosis present

## 2023-09-02 DIAGNOSIS — D72829 Elevated white blood cell count, unspecified: Secondary | ICD-10-CM | POA: Diagnosis not present

## 2023-09-02 DIAGNOSIS — E1169 Type 2 diabetes mellitus with other specified complication: Secondary | ICD-10-CM | POA: Diagnosis not present

## 2023-09-02 DIAGNOSIS — G8929 Other chronic pain: Secondary | ICD-10-CM | POA: Diagnosis present

## 2023-09-02 DIAGNOSIS — B952 Enterococcus as the cause of diseases classified elsewhere: Secondary | ICD-10-CM | POA: Diagnosis not present

## 2023-09-02 DIAGNOSIS — Z8249 Family history of ischemic heart disease and other diseases of the circulatory system: Secondary | ICD-10-CM

## 2023-09-02 DIAGNOSIS — L03311 Cellulitis of abdominal wall: Secondary | ICD-10-CM | POA: Diagnosis present

## 2023-09-02 DIAGNOSIS — N179 Acute kidney failure, unspecified: Secondary | ICD-10-CM | POA: Diagnosis present

## 2023-09-02 DIAGNOSIS — L0390 Cellulitis, unspecified: Secondary | ICD-10-CM

## 2023-09-02 DIAGNOSIS — K7460 Unspecified cirrhosis of liver: Secondary | ICD-10-CM | POA: Diagnosis present

## 2023-09-02 DIAGNOSIS — Z794 Long term (current) use of insulin: Secondary | ICD-10-CM

## 2023-09-02 DIAGNOSIS — I5033 Acute on chronic diastolic (congestive) heart failure: Secondary | ICD-10-CM | POA: Diagnosis not present

## 2023-09-02 DIAGNOSIS — R531 Weakness: Secondary | ICD-10-CM

## 2023-09-02 DIAGNOSIS — I10 Essential (primary) hypertension: Secondary | ICD-10-CM

## 2023-09-02 DIAGNOSIS — I340 Nonrheumatic mitral (valve) insufficiency: Secondary | ICD-10-CM | POA: Diagnosis not present

## 2023-09-02 DIAGNOSIS — Z87891 Personal history of nicotine dependence: Secondary | ICD-10-CM | POA: Diagnosis not present

## 2023-09-02 DIAGNOSIS — G9341 Metabolic encephalopathy: Secondary | ICD-10-CM | POA: Diagnosis present

## 2023-09-02 DIAGNOSIS — D696 Thrombocytopenia, unspecified: Secondary | ICD-10-CM | POA: Diagnosis not present

## 2023-09-02 DIAGNOSIS — R4182 Altered mental status, unspecified: Secondary | ICD-10-CM | POA: Diagnosis not present

## 2023-09-02 DIAGNOSIS — E871 Hypo-osmolality and hyponatremia: Secondary | ICD-10-CM | POA: Diagnosis present

## 2023-09-02 DIAGNOSIS — I3481 Nonrheumatic mitral (valve) annulus calcification: Secondary | ICD-10-CM | POA: Diagnosis present

## 2023-09-02 DIAGNOSIS — B3731 Acute candidiasis of vulva and vagina: Secondary | ICD-10-CM | POA: Diagnosis not present

## 2023-09-02 DIAGNOSIS — E870 Hyperosmolality and hypernatremia: Secondary | ICD-10-CM | POA: Diagnosis not present

## 2023-09-02 DIAGNOSIS — L304 Erythema intertrigo: Secondary | ICD-10-CM | POA: Diagnosis present

## 2023-09-02 DIAGNOSIS — K760 Fatty (change of) liver, not elsewhere classified: Secondary | ICD-10-CM | POA: Diagnosis present

## 2023-09-02 DIAGNOSIS — G894 Chronic pain syndrome: Secondary | ICD-10-CM | POA: Diagnosis not present

## 2023-09-02 DIAGNOSIS — B372 Candidiasis of skin and nail: Secondary | ICD-10-CM | POA: Diagnosis present

## 2023-09-02 DIAGNOSIS — S025XXS Fracture of tooth (traumatic), sequela: Secondary | ICD-10-CM

## 2023-09-02 DIAGNOSIS — A4181 Sepsis due to Enterococcus: Secondary | ICD-10-CM | POA: Diagnosis present

## 2023-09-02 DIAGNOSIS — R7881 Bacteremia: Secondary | ICD-10-CM | POA: Insufficient documentation

## 2023-09-02 DIAGNOSIS — R6521 Severe sepsis with septic shock: Secondary | ICD-10-CM | POA: Diagnosis present

## 2023-09-02 DIAGNOSIS — Z1152 Encounter for screening for COVID-19: Secondary | ICD-10-CM

## 2023-09-02 DIAGNOSIS — R739 Hyperglycemia, unspecified: Secondary | ICD-10-CM | POA: Insufficient documentation

## 2023-09-02 DIAGNOSIS — E1165 Type 2 diabetes mellitus with hyperglycemia: Secondary | ICD-10-CM | POA: Diagnosis present

## 2023-09-02 DIAGNOSIS — A419 Sepsis, unspecified organism: Principal | ICD-10-CM

## 2023-09-02 DIAGNOSIS — I361 Nonrheumatic tricuspid (valve) insufficiency: Secondary | ICD-10-CM | POA: Diagnosis not present

## 2023-09-02 DIAGNOSIS — Z6841 Body Mass Index (BMI) 40.0 and over, adult: Secondary | ICD-10-CM

## 2023-09-02 DIAGNOSIS — Z96652 Presence of left artificial knee joint: Secondary | ICD-10-CM | POA: Diagnosis present

## 2023-09-02 DIAGNOSIS — Z9189 Other specified personal risk factors, not elsewhere classified: Secondary | ICD-10-CM

## 2023-09-02 LAB — COMPREHENSIVE METABOLIC PANEL WITH GFR
ALT: 29 U/L (ref 0–44)
AST: 38 U/L (ref 15–41)
Albumin: 3.7 g/dL (ref 3.5–5.0)
Alkaline Phosphatase: 98 U/L (ref 38–126)
Anion gap: 16 — ABNORMAL HIGH (ref 5–15)
BUN: 26 mg/dL — ABNORMAL HIGH (ref 8–23)
CO2: 21 mmol/L — ABNORMAL LOW (ref 22–32)
Calcium: 10 mg/dL (ref 8.9–10.3)
Chloride: 85 mmol/L — ABNORMAL LOW (ref 98–111)
Creatinine, Ser: 1.3 mg/dL — ABNORMAL HIGH (ref 0.44–1.00)
GFR, Estimated: 45 mL/min — ABNORMAL LOW (ref >60)
Glucose, Bld: 594 mg/dL (ref 70–99)
Potassium: 4.4 mmol/L (ref 3.5–5.1)
Sodium: 122 mmol/L — ABNORMAL LOW (ref 135–145)
Total Bilirubin: 1.8 mg/dL — ABNORMAL HIGH (ref 0.0–1.2)
Total Protein: 7.7 g/dL (ref 6.5–8.1)

## 2023-09-02 LAB — HEMOGLOBIN A1C
Hgb A1c MFr Bld: 15.5 % — ABNORMAL HIGH (ref 4.8–5.6)
Mean Plasma Glucose: 398.15 mg/dL

## 2023-09-02 LAB — CBC WITH DIFFERENTIAL/PLATELET
Abs Immature Granulocytes: 0.13 10*3/uL — ABNORMAL HIGH (ref 0.00–0.07)
Basophils Absolute: 0.1 10*3/uL (ref 0.0–0.1)
Basophils Relative: 1 %
Eosinophils Absolute: 0 10*3/uL (ref 0.0–0.5)
Eosinophils Relative: 0 %
HCT: 45.5 % (ref 36.0–46.0)
Hemoglobin: 15.8 g/dL — ABNORMAL HIGH (ref 12.0–15.0)
Immature Granulocytes: 1 %
Lymphocytes Relative: 12 %
Lymphs Abs: 1.7 10*3/uL (ref 0.7–4.0)
MCH: 30.5 pg (ref 26.0–34.0)
MCHC: 34.7 g/dL (ref 30.0–36.0)
MCV: 87.8 fL (ref 80.0–100.0)
Monocytes Absolute: 1.9 10*3/uL — ABNORMAL HIGH (ref 0.1–1.0)
Monocytes Relative: 14 %
Neutro Abs: 9.9 10*3/uL — ABNORMAL HIGH (ref 1.7–7.7)
Neutrophils Relative %: 72 %
Platelets: 229 10*3/uL (ref 150–400)
RBC: 5.18 MIL/uL — ABNORMAL HIGH (ref 3.87–5.11)
RDW: 12.6 % (ref 11.5–15.5)
WBC: 13.7 10*3/uL — ABNORMAL HIGH (ref 4.0–10.5)
nRBC: 0 % (ref 0.0–0.2)

## 2023-09-02 LAB — BASIC METABOLIC PANEL WITH GFR
Anion gap: 7 (ref 5–15)
BUN: 18 mg/dL (ref 8–23)
CO2: 28 mmol/L (ref 22–32)
Calcium: 9.1 mg/dL (ref 8.9–10.3)
Chloride: 92 mmol/L — ABNORMAL LOW (ref 98–111)
Creatinine, Ser: 0.77 mg/dL (ref 0.44–1.00)
GFR, Estimated: >60 mL/min (ref >60)
Glucose, Bld: 177 mg/dL — ABNORMAL HIGH (ref 70–99)
Potassium: 3.5 mmol/L (ref 3.5–5.1)
Sodium: 127 mmol/L — ABNORMAL LOW (ref 135–145)

## 2023-09-02 LAB — URINALYSIS, W/ REFLEX TO CULTURE (INFECTION SUSPECTED)
Bacteria, UA: NONE SEEN
Bilirubin Urine: NEGATIVE
Glucose, UA: >=500 mg/dL — AB
Ketones, ur: NEGATIVE mg/dL
Leukocytes,Ua: NEGATIVE
Nitrite: NEGATIVE
Protein, ur: NEGATIVE mg/dL
Specific Gravity, Urine: 1.029 (ref 1.005–1.030)
pH: 5 (ref 5.0–8.0)

## 2023-09-02 LAB — GLUCOSE, CAPILLARY
Glucose-Capillary: 173 mg/dL — ABNORMAL HIGH (ref 70–99)
Glucose-Capillary: 177 mg/dL — ABNORMAL HIGH (ref 70–99)
Glucose-Capillary: 182 mg/dL — ABNORMAL HIGH (ref 70–99)
Glucose-Capillary: 192 mg/dL — ABNORMAL HIGH (ref 70–99)
Glucose-Capillary: 196 mg/dL — ABNORMAL HIGH (ref 70–99)
Glucose-Capillary: 200 mg/dL — ABNORMAL HIGH (ref 70–99)

## 2023-09-02 LAB — URINE DRUG SCREEN, QUALITATIVE (ARMC ONLY)
Amphetamines, Ur Screen: NOT DETECTED
Barbiturates, Ur Screen: NOT DETECTED
Benzodiazepine, Ur Scrn: NOT DETECTED
Cannabinoid 50 Ng, Ur ~~LOC~~: NOT DETECTED
Cocaine Metabolite,Ur ~~LOC~~: NOT DETECTED
MDMA (Ecstasy)Ur Screen: NOT DETECTED
Methadone Scn, Ur: NOT DETECTED
Opiate, Ur Screen: POSITIVE — AB
Phencyclidine (PCP) Ur S: NOT DETECTED
Tricyclic, Ur Screen: NOT DETECTED

## 2023-09-02 LAB — RESP PANEL BY RT-PCR (RSV, FLU A&B, COVID)  RVPGX2
Influenza A by PCR: NEGATIVE
Influenza B by PCR: NEGATIVE
Resp Syncytial Virus by PCR: NEGATIVE
SARS Coronavirus 2 by RT PCR: NEGATIVE

## 2023-09-02 LAB — PROTIME-INR
INR: 1.3 — ABNORMAL HIGH (ref 0.8–1.2)
Prothrombin Time: 16.2 s — ABNORMAL HIGH (ref 11.4–15.2)

## 2023-09-02 LAB — CBG MONITORING, ED
Glucose-Capillary: 475 mg/dL — ABNORMAL HIGH (ref 70–99)
Glucose-Capillary: 269 mg/dL — ABNORMAL HIGH (ref 70–99)
Glucose-Capillary: 316 mg/dL — ABNORMAL HIGH (ref 70–99)
Glucose-Capillary: 242 mg/dL — ABNORMAL HIGH (ref 70–99)
Glucose-Capillary: 194 mg/dL — ABNORMAL HIGH (ref 70–99)

## 2023-09-02 LAB — MRSA NEXT GEN BY PCR, NASAL: MRSA by PCR Next Gen: NOT DETECTED

## 2023-09-02 LAB — LACTIC ACID, PLASMA
Lactic Acid, Venous: 5.6 mmol/L (ref 0.5–1.9)
Lactic Acid, Venous: 3.4 mmol/L (ref 0.5–1.9)

## 2023-09-02 LAB — BETA-HYDROXYBUTYRIC ACID: Beta-Hydroxybutyric Acid: 0.83 mmol/L — ABNORMAL HIGH (ref 0.05–0.27)

## 2023-09-02 LAB — MAGNESIUM: Magnesium: 1.5 mg/dL — ABNORMAL LOW (ref 1.7–2.4)

## 2023-09-02 LAB — PHOSPHORUS: Phosphorus: 1.3 mg/dL — ABNORMAL LOW (ref 2.5–4.6)

## 2023-09-02 MED ORDER — FLUCONAZOLE 50 MG PO TABS: 150.0000 mg | ORAL_TABLET | ORAL | Status: DC

## 2023-09-02 MED ORDER — SODIUM CHLORIDE 0.9 % IV SOLN
2.0000 g | Freq: Once | INTRAVENOUS | Status: AC
  Administered 2023-09-02: 2 g via INTRAVENOUS
  Filled 2023-09-02: qty 12.5

## 2023-09-02 MED ORDER — LACTATED RINGERS IV BOLUS (SEPSIS)
1000.0000 mL | Freq: Once | INTRAVENOUS | Status: AC
  Administered 2023-09-02: 1000 mL via INTRAVENOUS

## 2023-09-02 MED ORDER — VANCOMYCIN VARIABLE DOSE PER UNSTABLE RENAL FUNCTION (PHARMACIST DOSING): Status: DC

## 2023-09-02 MED ORDER — HEPARIN SODIUM (PORCINE) 5000 UNIT/ML IJ SOLN
5000.0000 [IU] | Freq: Three times a day (TID) | INTRAMUSCULAR | Status: DC
  Administered 2023-09-02 – 2023-09-10 (×24): 5000 [IU] via SUBCUTANEOUS
  Filled 2023-09-02 (×24): qty 1

## 2023-09-02 MED ORDER — DEXTROSE-SODIUM CHLORIDE 5-0.9 % IV SOLN: INTRAVENOUS | Status: AC

## 2023-09-02 MED ORDER — METRONIDAZOLE 500 MG/100ML IV SOLN
500.0000 mg | Freq: Once | INTRAVENOUS | Status: AC
  Administered 2023-09-02: 500 mg via INTRAVENOUS
  Filled 2023-09-02: qty 100

## 2023-09-02 MED ORDER — LACTATED RINGERS IV BOLUS (SEPSIS)
500.0000 mL | Freq: Once | INTRAVENOUS | Status: AC
  Administered 2023-09-02: 500 mL via INTRAVENOUS

## 2023-09-02 MED ORDER — NYSTATIN 100000 UNIT/GM EX POWD
Freq: Three times a day (TID) | CUTANEOUS | Status: AC
  Filled 2023-09-02 (×2): qty 15

## 2023-09-02 MED ORDER — CHLORHEXIDINE GLUCONATE CLOTH 2 % EX PADS
6.0000 | MEDICATED_PAD | Freq: Every day | CUTANEOUS | Status: DC
Start: 2023-09-02 — End: 2023-09-05
  Administered 2023-09-02 – 2023-09-04 (×3): 6 via TOPICAL

## 2023-09-02 MED ORDER — HYDRALAZINE HCL 20 MG/ML IJ SOLN: 5.0000 mg | Freq: Four times a day (QID) | INTRAMUSCULAR | Status: AC | PRN

## 2023-09-02 MED ORDER — DEXTROSE 50 % IV SOLN: 0.0000 mL | INTRAVENOUS | Status: DC | PRN

## 2023-09-02 MED ORDER — INSULIN REGULAR(HUMAN) IN NACL 100-0.9 UT/100ML-% IV SOLN
INTRAVENOUS | Status: DC
  Administered 2023-09-02: 4.8 [IU]/h via INTRAVENOUS
  Administered 2023-09-03: 4.2 [IU]/h via INTRAVENOUS
  Filled 2023-09-02 (×2): qty 100

## 2023-09-02 MED ORDER — NYSTATIN 100000 UNIT/GM EX POWD
Freq: Once | CUTANEOUS | Status: AC
  Filled 2023-09-02: qty 15

## 2023-09-02 MED ORDER — DEXTROSE IN LACTATED RINGERS 5 % IV SOLN: INTRAVENOUS | Status: DC

## 2023-09-02 MED ORDER — FLUCONAZOLE 50 MG PO TABS
150.0000 mg | ORAL_TABLET | Freq: Once | ORAL | Status: AC
  Administered 2023-09-02: 150 mg via ORAL
  Filled 2023-09-02: qty 1

## 2023-09-02 MED ORDER — MAGNESIUM SULFATE 4 GM/100ML IV SOLN
4.0000 g | Freq: Once | INTRAVENOUS | Status: AC
  Administered 2023-09-02: 4 g via INTRAVENOUS
  Filled 2023-09-02: qty 100

## 2023-09-02 MED ORDER — POTASSIUM PHOSPHATES 15 MMOLE/5ML IV SOLN
30.0000 mmol | Freq: Once | INTRAVENOUS | Status: AC
  Administered 2023-09-03: 30 mmol via INTRAVENOUS
  Filled 2023-09-02: qty 10

## 2023-09-02 MED ORDER — LACTATED RINGERS IV SOLN: INTRAVENOUS | Status: AC

## 2023-09-02 MED ORDER — NICOTINE 14 MG/24HR TD PT24
14.0000 mg | MEDICATED_PATCH | Freq: Every day | TRANSDERMAL | Status: DC
Start: 2023-09-02 — End: 2023-09-10
  Administered 2023-09-02 – 2023-09-10 (×9): 14 mg via TRANSDERMAL
  Filled 2023-09-02 (×9): qty 1

## 2023-09-02 MED ORDER — VANCOMYCIN HCL IN DEXTROSE 1-5 GM/200ML-% IV SOLN
1000.0000 mg | Freq: Once | INTRAVENOUS | Status: AC
  Administered 2023-09-02: 1000 mg via INTRAVENOUS
  Filled 2023-09-02: qty 200

## 2023-09-02 MED ORDER — GABAPENTIN 100 MG PO CAPS
100.0000 mg | ORAL_CAPSULE | Freq: Once | ORAL | Status: AC
  Administered 2023-09-02: 100 mg via ORAL
  Filled 2023-09-02: qty 1

## 2023-09-02 MED ORDER — INSULIN ASPART 100 UNIT/ML IJ SOLN
10.0000 [IU] | Freq: Once | INTRAMUSCULAR | Status: AC
  Administered 2023-09-02: 10 [IU] via INTRAVENOUS
  Filled 2023-09-02: qty 1

## 2023-09-02 MED ORDER — LACTATED RINGERS IV SOLN: INTRAVENOUS | Status: DC

## 2023-09-02 MED ORDER — SODIUM CHLORIDE 0.9 % IV SOLN
2.0000 g | Freq: Two times a day (BID) | INTRAVENOUS | Status: DC
  Administered 2023-09-02: 2 g via INTRAVENOUS
  Filled 2023-09-02 (×2): qty 12.5

## 2023-09-02 NOTE — Assessment & Plan Note (Deleted)
 Suspect secondary to diabetes mellitus Suspect type 2 diabetes New diagnosis Check A1c in the a.m. CBG monitoring per Endo tool

## 2023-09-02 NOTE — ED Notes (Signed)
 Arrive to room. admitting MD at [facility]

## 2023-09-02 NOTE — ED Notes (Signed)
 CCMD called and verified patient on cardiac telemetry.

## 2023-09-02 NOTE — ED Notes (Signed)
Lab to add on Hemoglobin A1C 

## 2023-09-02 NOTE — H&P (Addendum)
 History and Physical   MARIELLEN HECIMOVICH Conway:[id number] DOB: 25-Dec-1955 DOA: 09/02/2023  [facility]P: [facility]  Patient coming from: home via EMS  I have personally briefly reviewed patient's old medical records in [facility] EMR.  Chief Concern: altered mental status  HPI: Shelby Conway is a 68 year old female with history of hypertension, history of stroke, morbid obesity, poor dentition, who presents ED via EMS from home for chief concerns of altered mental status.  Vitals in the ED showed T of 98, respiration rate of 14, heart rate of 113, blood pressure 96/78, improved to 125/65, SpO2 of 93% on 3 L nasal cannula.  Serum sodium is 122, potassium 4.4, chloride 85, bicarb 21, BUN of 26, serum creatinine 1.30, EGFR 45, nonfasting blood glucose 594, WBC 13.7, hemoglobin of 15.8, platelets of 229.  Anion gap was elevated at 16.  Lactic acid was 5.6.  UA was negative for ketones, leukocytes, nitrates.  Blood cultures x 2 are in process.  ED treatment: Diflucan  150 mg p.o. one-time dose, insulin  10 units, cefepime , Flagyl , vancomycin , LR 1 L bolus, LR sepsis bolus.  LR infusion at 150 mL/h. ------------------------------ At bedside, patient was able to tell me her first and last name, age, location [facility].  She reports she is here because she was confused.  She states she feels better at this time.  She denies chest pain, shortness of breath, nausea, vomiting, diarrhea, dysuria, hematuria, syncope.  She denies trauma to her person.  She reports that she has never been diagnosed with diabetes.   Social history: She lives at home with her son. She states that her son administers her medications for her.   ROS: Constitutional: no weight change, no fever ENT/Mouth: no sore throat, no rhinorrhea Eyes: no eye pain, no vision changes Cardiovascular: no chest pain, no dyspnea,  no edema, no palpitations Respiratory: no cough, no sputum, no wheezing Gastrointestinal:  no nausea, no vomiting, no diarrhea, no constipation Genitourinary: no urinary incontinence, no dysuria, no hematuria Musculoskeletal: no arthralgias, no myalgias Skin: no skin lesions, no pruritus, Neuro: + weakness, no loss of consciousness, no syncope Psych: no anxiety, no depression, + decrease appetite Heme/Lymph: no bruising, no bleeding  ED Course: Discussed with EDP, patient requiring hospitalization for chief concerns of altered mental status with severe hyperglycemia and concerns of cellulitis.  Assessment/Plan  Principal Problem:   Hyperosmolar hyperglycemic state (HHS) (HCC) Active Problems:   Sepsis due to cellulitis (HCC)   Altered mental status   Chronic pain   Leukocytosis   Hyperosmolar hyponatremia   At risk for polypharmacy   Vaginal candidiasis   Morbid obesity (HCC)   Essential hypertension   Hyperglycemia   Assessment and Plan:  * Hyperosmolar hyperglycemic state (HHS) (HCC) Continue insulin  per Endo tool Continue aggressive IV fluid bolus and treatment per sepsis criteria IV fluid with LR infusion at 125 mL/h for CBG > 250  Chronic pain PDMP reviewed Current active prescriptions: oxycodone  15 mg written and filled on 08/26/23, 30 days supplied, 105 tablets (same monthly prescription filled since November 2024. Gabapentin  100 mg capsules, 90 days supplies, 360 tablets  Patient states that she does not take her pain medications every day Patient states she only takes it as needed for gout Check UDS  Altered mental status Etiology workup in progress, differentials include hyperglycemia, possible hyperosmolar hyperglycemic state secondary to cellulitis  Sepsis due to cellulitis (HCC) Blood cultures x 2 are in process Continue with broad-spectrum antibiotic Vancomycin , cefepime  per  pharmacy Blood cultures x 2 are in process Continue with aggressive fluid hydration Maintain MAP > 65  Hyperglycemia Suspect secondary to diabetes mellitus Suspect  type 2 diabetes New diagnosis Check A1c in the a.m. CBG monitoring per Endo tool  Essential hypertension Hydralazine  5 mg IV every 6 hours as needed for SBP greater 175, 5 days with  Morbid obesity (HCC) This complicates overall care and prognosis.   Vaginal candidiasis With intertrigo Fluconazole  150 mg p.o. every 72 hours, to complete 3 doses  Hyperosmolar hyponatremia Corrected serum sodium is 134  Leukocytosis Recheck CBC in a.m.  Chart reviewed.   DVT prophylaxis: Heparin  5000 units subcutaneous every 8 hours Code Status: full code  Diet: N.p.o. Family Communication: attempted to call [name], [phone], a female picked up and states I called the wrong number. Called [phone], no pick up and no voicemail greeting. Disposition Plan: Pending clinical course Consults called: Pharmacy Admission status: Stepdown, inpatient  Past Medical History:  Diagnosis Date   Arthritis    CHF (congestive heart failure) (HCC)    Hypertension    Stroke [facility])    Past Surgical History:  Procedure Laterality Date   CARPAL TUNNEL RELEASE     CESAREAN SECTION     CHOLECYSTECTOMY     REPLACEMENT TOTAL KNEE     Social History:  reports that she has quit smoking. She has never used smokeless tobacco. She reports that she does not drink alcohol and does not use drugs.  Allergies  Allergen Reactions   Vicodin [Hydrocodone-Acetaminophen]    Family History  Problem Relation Age of Onset   Hypertension Mother    Family history: Family history reviewed and not pertinent.  Prior to Admission medications   Medication Sig Start Date End Date Taking? Authorizing Provider  albuterol  (VENTOLIN  HFA) 108 (90 Base) MCG/ACT inhaler Inhale 2 puffs into the lungs every 6 (six) hours as needed for wheezing or shortness of breath. 01/28/20   Joe Murders, PA   Physical Exam: Vitals:   09/02/23 1230 09/02/23 1300 09/02/23 1450 09/02/23 1500  BP: 132/85 (!) 136/57 (!) 160/85    Pulse: (!) 108 (!) 113 (!) 101   Resp:  20 19   Temp:      TempSrc:      SpO2: 100% 100% 98%   Weight:      Height:    5\' 2"  (1.575 m)   Constitutional: appears older than chronological age, frail, poor hygiene Eyes: PERRL, lids and conjunctivae normal ENMT: Mucous membranes are moist. Posterior pharynx clear of any exudate or lesions. Age-appropriate dentition. Hearing appropriate Neck: normal, supple, no masses, no thyromegaly Respiratory: clear to auscultation bilaterally, no wheezing, no crackles. Normal respiratory effort. No accessory muscle use.  Cardiovascular: Regular rate and rhythm, no murmurs / rubs / gallops. No extremity edema. 2+ pedal pulses. No carotid bruits.  Abdomen: Morbidly obese abdomen, + suprapubic tenderness, no masses palpated, no hepatosplenomegaly. Bowel sounds positive.  Musculoskeletal: no clubbing / cyanosis. No joint deformity upper and lower extremities. Good ROM, no contractures, no atrophy. Normal muscle tone.  Skin: + panus redness and inflammation with erythema  Neurologic: Sensation intact. Strength 5/5 in all 4.  Psychiatric: Normal judgment and insight. Alert and oriented x 3. Normal mood.   EKG: independently reviewed, showing ST with PVC rate of 109, qtc 439.  Chest x-ray on Admission: I personally reviewed and I agree with radiologist reading as below.  DG Chest Port 1 View Result Date: 09/02/2023 CLINICAL DATA:  Altered mental status. EXAM: PORTABLE CHEST 1 VIEW COMPARISON:  January 28, 2020. FINDINGS: Stable cardiomediastinal silhouette. Both lungs are clear. The visualized skeletal structures are unremarkable. IMPRESSION: No active disease. Electronically Signed   By: Rosalene Colon M.D.   On: 09/02/2023 14:02   Labs on Admission: I have personally reviewed following labs  CBC: Recent Labs  Lab 09/02/23 1135  WBC 13.7*  NEUTROABS 9.9*  HGB 15.8*  HCT 45.5  MCV 87.8  PLT 229   Basic Metabolic Panel: Recent Labs  Lab  09/02/23 1135  NA 122*  K 4.4  CL 85*  CO2 21*  GLUCOSE 594*  BUN 26*  CREATININE 1.30*  CALCIUM 10.0   GFR: Estimated Creatinine Clearance: 46.1 mL/min (A) (by C-G formula based on SCr of 1.3 mg/dL (H)).  Liver Function Tests: Recent Labs  Lab 09/02/23 1135  AST 38  ALT 29  ALKPHOS 98  BILITOT 1.8*  PROT 7.7  ALBUMIN 3.7   CBG: Recent Labs  Lab 09/02/23 1345  GLUCAP 475*   Urine analysis:    Component Value Date/Time   COLORURINE YELLOW (A) 09/02/2023 1217   APPEARANCEUR CLEAR (A) 09/02/2023 1217   APPEARANCEUR Hazy 05/27/2013 0943   LABSPEC 1.029 09/02/2023 1217   LABSPEC 1.016 05/27/2013 0943   PHURINE 5.0 09/02/2023 1217   GLUCOSEU >=500 (A) 09/02/2023 1217   GLUCOSEU Negative 05/27/2013 0943   HGBUR SMALL (A) 09/02/2023 1217   BILIRUBINUR NEGATIVE 09/02/2023 1217   BILIRUBINUR Negative 05/27/2013 0943   KETONESUR NEGATIVE 09/02/2023 1217   PROTEINUR NEGATIVE 09/02/2023 1217   NITRITE NEGATIVE 09/02/2023 1217   LEUKOCYTESUR NEGATIVE 09/02/2023 1217   LEUKOCYTESUR Negative 05/27/2013 0943   CRITICAL CARE Performed by: Dr. Reinhold Carbine   Total critical care time: 32 minutes  Critical care time was exclusive of separately billable procedures and treating other patients.  Critical care was necessary to treat or prevent imminent or life-threatening deterioration.  Critical care was time spent personally by me on the following activities: development of treatment plan with patient, as well as nursing, discussions with consultants, evaluation of patient's response to treatment, examination of patient, obtaining history from patient or surrogate, ordering and performing treatments and interventions, ordering and review of laboratory studies, ordering and review of radiographic studies, pulse oximetry and re-evaluation of patient's condition.  This document was prepared using Dragon Voice Recognition software and may include unintentional dictation errors.  Dr.  Reinhold Carbine [facility]  If 7PM-7AM, please contact overnight-coverage provider If 7AM-7PM, please contact day attending provider [email or web address]  09/02/2023, 3:13 PM

## 2023-09-02 NOTE — Assessment & Plan Note (Addendum)
 Blood pressure currently within goal.  Initial home amlodipine was held for concern of sepsis.  We can resume if needed Hydralazine  5 mg IV every 6 hours as needed for SBP greater 175, 5 days with

## 2023-09-02 NOTE — Assessment & Plan Note (Addendum)
 Estimated body mass index is 39.68 kg/m as calculated from the following:   Height as of this encounter: 5\' 2"  (1.575 m).   Weight as of this encounter: 98.4 kg.   This complicates overall care and prognosis.  Counseling was provided.

## 2023-09-02 NOTE — Assessment & Plan Note (Addendum)
 Patient with lower abdominal cellulitis, met sepsis criteria with leukocytosis, tachycardia and tachypnea. Blood cultures growing Enterococcus faecalis. Initially received broad-spectrum antibiotics which are now being de-escalated to Unasyn . ID was also consulted - Repeat blood cultures tomorrow -Supportive care

## 2023-09-02 NOTE — Assessment & Plan Note (Addendum)
 Corrected serum sodium is 134 - Continue to monitor

## 2023-09-02 NOTE — Assessment & Plan Note (Addendum)
 PDMP reviewed Current active prescriptions: oxycodone  15 mg written and filled on 08/26/23, 30 days supplied, 105 tablets (same monthly prescription filled since November 2024. Gabapentin  100 mg capsules, 90 days supplies, 360 tablets  Patient states that she does not take her pain medications every day Patient states she only takes it as needed for gout UDS positive for opioids

## 2023-09-02 NOTE — Sepsis Progress Note (Signed)
 Sepsis protocol monitored by eLink ?

## 2023-09-02 NOTE — Assessment & Plan Note (Signed)
 With intertrigo Fluconazole  150 mg p.o. every 72 hours, to complete 3 doses

## 2023-09-02 NOTE — Consult Note (Addendum)
 Pharmacy Antibiotic Note  ASSESSMENT: 68 y.o. female with PMH including HTN, CVA, poor dentition is presenting with sepsis in setting of cellulitis, and HHS. Patient also with vaginal candidiasis (receiving oral fluconazole  x 3 doses). Patient was hypotensive, hypoxic, now on 3L O2 via [place]. Also tachypneic with leukocytosis, and elevated LA. CXR negative for acute findings. Pharmacy has been consulted to manage cefepime  and vancomycin  dosing. Considering patient to be in AKI given Scr 1.3 today, with last known Scr lab value <0.8 in 2021.  Patient measurements: Height: 5\' 2"  (157.5 cm) Weight: 101.2 kg (223 lb) IBW/kg (Calculated) : 50.1  Vital signs: Temp: 98 F (36.7 C) (05/01 1149) Temp Source: Oral (05/01 1149) BP: 160/85 (05/01 1450) Pulse Rate: 101 (05/01 1450) Recent Labs  Lab 09/02/23 1135  WBC 13.7*  CREATININE 1.30*   Estimated Creatinine Clearance: 46.1 mL/min (A) (by C-G formula based on SCr of 1.3 mg/dL (H)).  Allergies: Allergies  Allergen Reactions   Vicodin [Hydrocodone-Acetaminophen]     Antimicrobials this admission: Cefepime  5/1 >>  Vancomycin  5/1 >> Fluconazole  (PO)  Dose adjustments this admission: N/A  Microbiology results: 5/1 BCx: in process 5/1 COVID/Flu/RSV PCR: negative   PLAN: Initiate cefepime  2g IV q12H Administer vancomycin  2000mg  IV as a loading dose Estimated half-life of this loading dose with patient's current renal function is 22 hrs. Obtain 24-hr vancomycin  random level before giving further vancomycin  Follow up culture results to assess for antibiotic optimization. Monitor renal function to assess for any necessary antibiotic dosing changes.   Thank you for allowing pharmacy to be a part of this patient's care.  Will M. Alva Jewels, PharmD Clinical Pharmacist 09/02/2023 3:21 PM

## 2023-09-02 NOTE — Hospital Course (Addendum)
 Shelby Conway is a 68 year old female with history of hypertension, history of stroke, morbid obesity, poor dentition, who presents ED via EMS from home for chief concerns of altered mental status.  Vitals in the ED showed T of 98, respiration rate of 14, heart rate of 113, blood pressure 96/78, improved to 125/65, SpO2 of 93% on 3 L nasal cannula.  Serum sodium is 122, potassium 4.4, chloride 85, bicarb 21, BUN of 26, serum creatinine 1.30, EGFR 45, nonfasting blood glucose 594, WBC 13.7, hemoglobin of 15.8, platelets of 229.  Anion gap was elevated at 16.  Lactic acid was 5.6.  UA was negative for ketones, leukocytes, nitrates.  Blood cultures x 2 are in process.  ED treatment: Diflucan  150 mg p.o. one-time dose, insulin  10 units, cefepime , Flagyl , vancomycin , LR 1 L bolus, LR sepsis bolus.  LR infusion at 150 mL/h.  5/2: Vital stable.  Preliminary blood cultures with Enterococcus faecalis-broad-spectrum antibiotics are being switched with Unasyn .  ID was also consulted.  CBG now improved to 177 and gap has been closed, CO2 of 25 now.  Patient with no prior diagnosis of diabetes. Transitioning to basal and short-acting. Will repeat blood cultures tomorrow

## 2023-09-02 NOTE — Assessment & Plan Note (Addendum)
 Improved.  Likely multifactorial with hyperosmolar hyperglycemia and also concern of sepsis. - Continue to monitor

## 2023-09-02 NOTE — Assessment & Plan Note (Addendum)
 Improving.  Continue to monitor.

## 2023-09-02 NOTE — Consult Note (Signed)
 CODE SEPSIS - PHARMACY COMMUNICATION  **Broad-spectrum antimicrobials should be administered within one hour of sepsis diagnosis**  Time Code Sepsis call or page was received: 1150  Antibiotics ordered: Cefepime , Vancomycin , Metronidazole   Time of first antibiotic administration: 1232  Additional action taken by pharmacy: N/A  If necessary, name of provider/nurse contacted: N/A    Will M. Alva Jewels, PharmD Clinical Pharmacist 09/02/2023 11:52 AM

## 2023-09-02 NOTE — Assessment & Plan Note (Addendum)
 Patient with new diagnosis of diabetes mellitus, admitted with significantly elevated blood glucose level above 500 with concern of hyperosmolar hyperglycemia, gap Was elevated on admission but quickly improved with IV hydration. Likely type 2 diabetes mellitus. Initially managed with insulin  infusion and now is being transitioned to basal and short-acting. Diabetes education consult -Currently on Semglee  20 units daily -Moderate SSI -Continue to monitor

## 2023-09-02 NOTE — ED Notes (Signed)
 Insulin  switched to R anterior FA. L hand flushed and locked. Pt removed purewick and ETCO2 [place]. Remains on RA. A&O with some confusion.

## 2023-09-02 NOTE — ED Provider Notes (Signed)
 [facility] Provider Note    Event Date/Time   First MD Initiated Contact with Patient 09/02/23 1137     (approximate)  History   Chief Complaint: Altered mental status HPI  Shelby Conway is a 68 y.o. female with a past medical history of arthritis, CHF, hypertension, CVA, presents to the emergency department with EMS for altered mental status.  According to EMS family called out for worsening confusion since this morning.  They thought the patient could have a fever/chills at home.  EMS got a 99.8 temperature and route to the hospital.  Found the patient to be tachycardic and borderline tachypneic.  They also noted a significant rash across the lower abdomen.  Here the patient is awake alert she is oriented to person and place, but does appear confused, slow responses.  Physical Exam   Triage Vital Signs: ED Triage Vitals  Encounter Vitals Group     BP 09/02/23 1149 96/78     Systolic BP Percentile --      Diastolic BP Percentile --      Pulse Rate 09/02/23 1149 (!) 113     Resp 09/02/23 1149 18     Temp 09/02/23 1149 98 F (36.7 C)     Temp Source 09/02/23 1149 Oral     SpO2 09/02/23 1139 92 %     Weight 09/02/23 1149 223 lb (101.2 kg)     Height --      Head Circumference --      Peak Flow --      Pain Score 09/02/23 1149 0     Pain Loc --      Pain Education --      Exclude from Growth Chart --     Most recent vital signs: Vitals:   09/02/23 1149 09/02/23 1151  BP: 96/78   Pulse: (!) 113   Resp: 18   Temp: 98 F (36.7 C)   SpO2: (!) 89% 93%    General: Awake with no acute distress.  Patient does appear somewhat confused and has slower responses. CV:  Good peripheral perfusion.  Regular rhythm rate around 120 bpm. Resp:  Equal breath sounds bilaterally.  Mild tachypnea.  No wheeze rales or rhonchi. Abd:  No distention.  Soft, nontender.  No rebound or guarding.  Patient has a significant rash of her lower abdomen/pannus consistent  with likely fungal infection.   ED Results / Procedures / Treatments   EKG  EKG viewed and interpreted by myself shows sinus tachycardia 109 bpm with a narrow QRS, normal axis, normal intervals, nonspecific ST changes.  No ST elevation.  RADIOLOGY  I have reviewed interpret the chest x-ray images.  No consolidation seen on my evaluation.   MEDICATIONS ORDERED IN ED: Medications  lactated ringers  infusion (has no administration in time range)  lactated ringers  bolus 1,000 mL (1,000 mLs Intravenous New Bag/Given 09/02/23 1237)  ceFEPIme  (MAXIPIME ) 2 g in sodium chloride  0.9 % 100 mL IVPB (2 g Intravenous New Bag/Given 09/02/23 1232)  metroNIDAZOLE  (FLAGYL ) IVPB 500 mg (has no administration in time range)  vancomycin  (VANCOCIN ) IVPB 1000 mg/200 mL premix (has no administration in time range)  nystatin  (MYCOSTATIN /NYSTOP ) topical powder (has no administration in time range)  fluconazole  (DIFLUCAN ) tablet 150 mg (150 mg Oral Given 09/02/23 1230)     IMPRESSION / MDM / ASSESSMENT AND PLAN / ED COURSE  I reviewed the triage vital signs and the nursing notes.  Patient's presentation is most consistent with  acute presentation with potential threat to life or bodily function.  Patient presents to the emergency department with altered mental status since earlier this morning.  Patient noted to have significant rash over her pannus/lower abdomen most consistent with fungal infection.  We will dose nystatin  topically as well as Diflucan  orally.  Given the patient's low-grade temperature by EMS 99.8 tachycardia and tachypnea with altered mental status we will activate code sepsis protocol start the patient on IV antibiotics send cultures labs and continue to closely monitor.  Patient denies any nausea vomiting diarrhea or urinary symptoms however does appear somewhat confused so history may not be reliable.  Patient's lab work today shows leukocytosis of 13,000, urinalysis negative for infection.   Lactic acid significantly elevated at 5.6.  I have ordered 30 mL/kg of IV fluids.  Will admit to the hospitalist once the patient's remaining labs have resulted.   Patient's chemistry is resulted showing elevated blood glucose 594 with anion gap of 16.  Pseudohyponatremia.  We will dose IV fluids and continue to closely monitor.  I have ordered 10 units of IV insulin  as well.  Do not believe the patient quite requires an IV insulin  infusion.  CRITICAL CARE Performed by: Ruth Cove   Total critical care time: 45 minutes  Critical care time was exclusive of separately billable procedures and treating other patients.  Critical care was necessary to treat or prevent imminent or life-threatening deterioration.  Critical care was time spent personally by me on the following activities: development of treatment plan with patient and/or surrogate as well as nursing, discussions with consultants, evaluation of patient's response to treatment, examination of patient, obtaining history from patient or surrogate, ordering and performing treatments and interventions, ordering and review of laboratory studies, ordering and review of radiographic studies, pulse oximetry and re-evaluation of patient's condition.   FINAL CLINICAL IMPRESSION(S) / ED DIAGNOSES   Fungal infection Sepsis Cellulitis  Note:  This document was prepared using Dragon voice recognition software and may include unintentional dictation errors.   Ruth Cove, MD 09/02/23 1341

## 2023-09-03 DIAGNOSIS — B952 Enterococcus as the cause of diseases classified elsewhere: Secondary | ICD-10-CM

## 2023-09-03 DIAGNOSIS — L0390 Cellulitis, unspecified: Secondary | ICD-10-CM

## 2023-09-03 DIAGNOSIS — A419 Sepsis, unspecified organism: Secondary | ICD-10-CM

## 2023-09-03 DIAGNOSIS — R4182 Altered mental status, unspecified: Secondary | ICD-10-CM

## 2023-09-03 DIAGNOSIS — B372 Candidiasis of skin and nail: Secondary | ICD-10-CM | POA: Diagnosis not present

## 2023-09-03 DIAGNOSIS — D72829 Elevated white blood cell count, unspecified: Secondary | ICD-10-CM

## 2023-09-03 DIAGNOSIS — B3731 Acute candidiasis of vulva and vagina: Secondary | ICD-10-CM

## 2023-09-03 DIAGNOSIS — E870 Hyperosmolality and hypernatremia: Secondary | ICD-10-CM

## 2023-09-03 DIAGNOSIS — E1100 Type 2 diabetes mellitus with hyperosmolarity without nonketotic hyperglycemic-hyperosmolar coma (NKHHC): Secondary | ICD-10-CM | POA: Diagnosis not present

## 2023-09-03 DIAGNOSIS — Z794 Long term (current) use of insulin: Secondary | ICD-10-CM

## 2023-09-03 DIAGNOSIS — R7881 Bacteremia: Secondary | ICD-10-CM | POA: Diagnosis not present

## 2023-09-03 DIAGNOSIS — I10 Essential (primary) hypertension: Secondary | ICD-10-CM

## 2023-09-03 DIAGNOSIS — E871 Hypo-osmolality and hyponatremia: Secondary | ICD-10-CM

## 2023-09-03 DIAGNOSIS — G894 Chronic pain syndrome: Secondary | ICD-10-CM

## 2023-09-03 LAB — CBC
HCT: 39.1 % (ref 36.0–46.0)
Hemoglobin: 13.5 g/dL (ref 12.0–15.0)
MCH: 30.2 pg (ref 26.0–34.0)
MCHC: 34.5 g/dL (ref 30.0–36.0)
MCV: 87.5 fL (ref 80.0–100.0)
Platelets: 134 10*3/uL — ABNORMAL LOW (ref 150–400)
RBC: 4.47 MIL/uL (ref 3.87–5.11)
RDW: 12.7 % (ref 11.5–15.5)
WBC: 13 10*3/uL — ABNORMAL HIGH (ref 4.0–10.5)
nRBC: 0 % (ref 0.0–0.2)

## 2023-09-03 LAB — BLOOD CULTURE ID PANEL (REFLEXED) - BCID2

## 2023-09-03 LAB — GLUCOSE, CAPILLARY
Glucose-Capillary: 172 mg/dL — ABNORMAL HIGH (ref 70–99)
Glucose-Capillary: 168 mg/dL — ABNORMAL HIGH (ref 70–99)
Glucose-Capillary: 189 mg/dL — ABNORMAL HIGH (ref 70–99)
Glucose-Capillary: 187 mg/dL — ABNORMAL HIGH (ref 70–99)
Glucose-Capillary: 220 mg/dL — ABNORMAL HIGH (ref 70–99)
Glucose-Capillary: 197 mg/dL — ABNORMAL HIGH (ref 70–99)
Glucose-Capillary: 214 mg/dL — ABNORMAL HIGH (ref 70–99)
Glucose-Capillary: 177 mg/dL — ABNORMAL HIGH (ref 70–99)
Glucose-Capillary: 164 mg/dL — ABNORMAL HIGH (ref 70–99)
Glucose-Capillary: 291 mg/dL — ABNORMAL HIGH (ref 70–99)
Glucose-Capillary: 251 mg/dL — ABNORMAL HIGH (ref 70–99)

## 2023-09-03 LAB — BASIC METABOLIC PANEL WITH GFR
Anion gap: 6 (ref 5–15)
Anion gap: 8 (ref 5–15)
BUN: 16 mg/dL (ref 8–23)
BUN: 15 mg/dL (ref 8–23)
CO2: 28 mmol/L (ref 22–32)
CO2: 25 mmol/L (ref 22–32)
Calcium: 8.8 mg/dL — ABNORMAL LOW (ref 8.9–10.3)
Calcium: 8.4 mg/dL — ABNORMAL LOW (ref 8.9–10.3)
Chloride: 93 mmol/L — ABNORMAL LOW (ref 98–111)
Chloride: 96 mmol/L — ABNORMAL LOW (ref 98–111)
Creatinine, Ser: 0.7 mg/dL (ref 0.44–1.00)
Creatinine, Ser: 0.62 mg/dL (ref 0.44–1.00)
GFR, Estimated: >60 mL/min (ref >60)
GFR, Estimated: >60 mL/min (ref >60)
Glucose, Bld: 164 mg/dL — ABNORMAL HIGH (ref 70–99)
Glucose, Bld: 206 mg/dL — ABNORMAL HIGH (ref 70–99)
Potassium: 3.3 mmol/L — ABNORMAL LOW (ref 3.5–5.1)
Potassium: 3.7 mmol/L (ref 3.5–5.1)
Sodium: 127 mmol/L — ABNORMAL LOW (ref 135–145)
Sodium: 129 mmol/L — ABNORMAL LOW (ref 135–145)

## 2023-09-03 LAB — MAGNESIUM: Magnesium: 2.6 mg/dL — ABNORMAL HIGH (ref 1.7–2.4)

## 2023-09-03 LAB — PHOSPHORUS: Phosphorus: 2 mg/dL — ABNORMAL LOW (ref 2.5–4.6)

## 2023-09-03 MED ORDER — OXYCODONE HCL 5 MG PO TABS
5.0000 mg | ORAL_TABLET | Freq: Once | ORAL | Status: AC
  Administered 2023-09-03: 5 mg via ORAL
  Filled 2023-09-03: qty 1

## 2023-09-03 MED ORDER — THIAMINE HCL 100 MG PO TABS
100.0000 mg | ORAL_TABLET | Freq: Every day | ORAL | Status: AC
  Administered 2023-09-03 – 2023-09-09 (×7): 100 mg via ORAL
  Filled 2023-09-03 (×13): qty 1

## 2023-09-03 MED ORDER — SODIUM CHLORIDE 0.9 % IV SOLN
3.0000 g | Freq: Four times a day (QID) | INTRAVENOUS | Status: DC
  Administered 2023-09-03 – 2023-09-09 (×26): 3 g via INTRAVENOUS
  Filled 2023-09-03 (×28): qty 8

## 2023-09-03 MED ORDER — POTASSIUM CHLORIDE CRYS ER 20 MEQ PO TBCR
40.0000 meq | EXTENDED_RELEASE_TABLET | Freq: Once | ORAL | Status: AC
  Administered 2023-09-03: 40 meq via ORAL
  Filled 2023-09-03: qty 2

## 2023-09-03 MED ORDER — POLYETHYLENE GLYCOL 3350 17 G PO PACK
17.0000 g | PACK | Freq: Every day | ORAL | Status: DC
  Administered 2023-09-03 – 2023-09-05 (×2): 17 g via ORAL
  Filled 2023-09-03 (×8): qty 1

## 2023-09-03 MED ORDER — ADULT MULTIVITAMIN W/MINERALS CH
1.0000 | ORAL_TABLET | Freq: Every day | ORAL | Status: DC
  Administered 2023-09-04 – 2023-09-10 (×7): 1 via ORAL
  Filled 2023-09-03 (×7): qty 1

## 2023-09-03 MED ORDER — OXYCODONE HCL 5 MG PO TABS
10.0000 mg | ORAL_TABLET | Freq: Four times a day (QID) | ORAL | Status: DC | PRN
  Administered 2023-09-03 – 2023-09-05 (×6): 10 mg via ORAL
  Filled 2023-09-03 (×7): qty 2

## 2023-09-03 MED ORDER — LIVING WELL WITH DIABETES BOOK
Freq: Once | Status: AC
  Filled 2023-09-03: qty 1

## 2023-09-03 MED ORDER — INSULIN ASPART 100 UNIT/ML IJ SOLN
0.0000 [IU] | Freq: Every day | INTRAMUSCULAR | Status: DC
Start: 2023-09-03 — End: 2023-09-10
  Administered 2023-09-03: 3 [IU] via SUBCUTANEOUS
  Administered 2023-09-04: 4 [IU] via SUBCUTANEOUS
  Administered 2023-09-05: 2 [IU] via SUBCUTANEOUS
  Administered 2023-09-09: 3 [IU] via SUBCUTANEOUS
  Filled 2023-09-03 (×4): qty 1

## 2023-09-03 MED ORDER — VITAMIN C 500 MG PO TABS
500.0000 mg | ORAL_TABLET | Freq: Two times a day (BID) | ORAL | Status: DC
  Administered 2023-09-03 – 2023-09-10 (×14): 500 mg via ORAL
  Filled 2023-09-03 (×14): qty 1

## 2023-09-03 MED ORDER — INSULIN GLARGINE-YFGN 100 UNIT/ML ~~LOC~~ SOLN
20.0000 [IU] | Freq: Every day | SUBCUTANEOUS | Status: DC
  Administered 2023-09-03: 20 [IU] via SUBCUTANEOUS
  Filled 2023-09-03 (×2): qty 0.2

## 2023-09-03 MED ORDER — SODIUM CHLORIDE 0.9 % IV SOLN
2.0000 g | INTRAVENOUS | Status: DC
  Filled 2023-09-03 (×2): qty 2000

## 2023-09-03 MED ORDER — OXYCODONE HCL 5 MG PO TABS
10.0000 mg | ORAL_TABLET | Freq: Once | ORAL | Status: AC
  Administered 2023-09-03: 10 mg via ORAL
  Filled 2023-09-03: qty 2

## 2023-09-03 MED ORDER — INSULIN ASPART 100 UNIT/ML IJ SOLN
0.0000 [IU] | Freq: Three times a day (TID) | INTRAMUSCULAR | Status: DC
  Administered 2023-09-03: 5 [IU] via SUBCUTANEOUS
  Administered 2023-09-04 – 2023-09-05 (×4): 8 [IU] via SUBCUTANEOUS
  Administered 2023-09-05: 11 [IU] via SUBCUTANEOUS
  Administered 2023-09-05: 15 [IU] via SUBCUTANEOUS
  Administered 2023-09-06 (×2): 8 [IU] via SUBCUTANEOUS
  Administered 2023-09-06 – 2023-09-07 (×4): 5 [IU] via SUBCUTANEOUS
  Administered 2023-09-08: 3 [IU] via SUBCUTANEOUS
  Administered 2023-09-08 (×2): 2 [IU] via SUBCUTANEOUS
  Administered 2023-09-09: 5 [IU] via SUBCUTANEOUS
  Administered 2023-09-09: 3 [IU] via SUBCUTANEOUS
  Administered 2023-09-09: 2 [IU] via SUBCUTANEOUS
  Administered 2023-09-10 (×2): 5 [IU] via SUBCUTANEOUS
  Filled 2023-09-03 (×21): qty 1

## 2023-09-03 MED ORDER — ENSURE MAX PROTEIN PO LIQD
11.0000 [oz_av] | Freq: Two times a day (BID) | ORAL | Status: DC
  Administered 2023-09-03 – 2023-09-10 (×12): 11 [oz_av] via ORAL
  Filled 2023-09-03: qty 330

## 2023-09-03 MED ORDER — FLUCONAZOLE 100 MG PO TABS
200.0000 mg | ORAL_TABLET | Freq: Every day | ORAL | Status: AC
  Administered 2023-09-03 – 2023-09-08 (×6): 200 mg via ORAL
  Filled 2023-09-03 (×6): qty 2

## 2023-09-03 NOTE — Plan of Care (Signed)
  Problem: Nutritional: Goal: Maintenance of adequate nutrition will improve Outcome: Progressing   Problem: Cardiac: Goal: Ability to maintain an adequate cardiac output will improve Outcome: Progressing   Problem: Metabolic: Goal: Ability to maintain appropriate glucose levels will improve Outcome: Progressing   Problem: Respiratory: Goal: Will regain and/or maintain adequate ventilation Outcome: Progressing   Problem: Urinary Elimination: Goal: Ability to achieve and maintain adequate renal perfusion and functioning will improve Outcome: Progressing

## 2023-09-03 NOTE — Plan of Care (Signed)
 Nutrition Education Note   RD consulted for nutrition education regarding diabetes.   68 year old female with history of hypertension, stroke, CHF, obesity, poor dentition and chronic pain who is admitted with AMS, HHS, sepsis, cellulitis and new DM.   Lab Results  Component Value Date   HGBA1C 15.5 (H) 09/02/2023    RD provided "Nutrition and Type II Diabetes" handout from the Academy of [employer] Dietetics. Discussed different food groups and their effects on blood sugar, emphasizing carbohydrate-containing foods. Provided list of carbohydrates and recommended serving sizes of common foods.  Discussed importance of controlled and consistent carbohydrate intake throughout the day. Provided examples of ways to balance meals/snacks and encouraged intake of high-fiber, whole grain complex carbohydrates. Teach back method used.  Expect good compliance.  Body mass index is 39.68 kg/m. Pt meets criteria for obesity based on current BMI.  RD following this patient   Torrance Freestone MS, RD, LDN If unable to be reached, please send secure chat to "RD inpatient" available from 8:00a-4:00p daily

## 2023-09-03 NOTE — Progress Notes (Addendum)
 Initial Nutrition Assessment  DOCUMENTATION CODES:   Obesity unspecified  INTERVENTION:   Ensure Max protein supplement po BID, each supplement provides 150kcal and 30g of protein.  MVI po daily   Thiamine  100mg  po daily x 7 days   Vitamin C 500mg  po BID  Pt at high refeed risk; recommend monitor potassium, magnesium  and phosphorus labs daily until stable  Daily weights   Diabetic diet education   NUTRITION DIAGNOSIS:   Inadequate oral intake related to acute illness as evidenced by per patient/family report.  GOAL:   Patient will meet greater than or equal to 90% of their needs  MONITOR:   PO intake, Supplement acceptance, Labs, Weight trends, Skin, I & O's  REASON FOR ASSESSMENT:   Consult Diet education  ASSESSMENT:   68 year old female with history of hypertension, stroke, CHF, obesity, poor dentition and chronic pain who is admitted with AMS, HHS, sepsis, cellulitis and new DM.  Met with pt in room today. Pt reports decreased appetite and oral intake for several days pta. Pt reports taste changes and no desire to eat. Pt reports that she believes she has lost ~15lbs; there is no recent documented history in chart to confirm weight loss. Pt initiated on a diet today. Pt reports that she is starving. RD discussed with pt the importance of adequate nutrition needed to preserve lean muscle. Pt is agreeable to drinking chocolate Ensure. RD will add supplements and vitamins to help pt meet her estimated needs and to support wound healing. Pt is actively refeeding; electrolytes being monitored and supplemented.   Diabetic diet education provided today    Medications reviewed and include: diflucan , heparin , insulin , nicotine , unasyn , NaCl w/ 5% dextrose  @125ml /hr, insulin    Labs reviewed: Na 129(L), K 3.7 wnl P 1.3(L), Hct 1.5(L)- 5/1 Wbc- 13.0(H) Cbgs- 164, 177, 214, 197, 220, 187, 189 x 24 hrs  AIC 15.5(H)  NUTRITION - FOCUSED PHYSICAL EXAM:  Flowsheet Row Most  Recent Value  Orbital Region No depletion  Upper Arm Region No depletion  Thoracic and Lumbar Region No depletion  Buccal Region No depletion  Temple Region No depletion  Clavicle Bone Region No depletion  Clavicle and Acromion Bone Region No depletion  Scapular Bone Region No depletion  Dorsal Hand No depletion  Patellar Region No depletion  Anterior Thigh Region No depletion  Posterior Calf Region No depletion  Edema (RD Assessment) None  Hair Reviewed  Eyes Reviewed  Mouth Reviewed  Skin Reviewed  Nails Reviewed   Diet Order:   Diet Order             Diet Carb Modified Fluid consistency: Thin; Room service appropriate? Yes  Diet effective now                  EDUCATION NEEDS:   Education needs have been addressed  Skin:  Skin Assessment: Reviewed RN Assessment (cellulitis abdominal skin folds and groin area)  Last BM:  pta  Height:   Ht Readings from Last 1 Encounters:  09/02/23 5\' 2"  (1.575 m)    Weight:   Wt Readings from Last 1 Encounters:  09/02/23 98.4 kg    Ideal Body Weight:  50 kg  BMI:  Body mass index is 39.68 kg/m.  Estimated Nutritional Needs:   Kcal:  1800-2100kcal/day  Protein:  90-105g/day  Fluid:  1.6-1.8L/day  Torrance Freestone MS, RD, LDN If unable to be reached, please send secure chat to "RD inpatient" available from 8:00a-4:00p daily

## 2023-09-03 NOTE — Progress Notes (Signed)
 Progress Note   Patient: Shelby Conway ZOX:[id number] DOB: July 10, 1955 DOA: 09/02/2023     1 DOS: the patient was seen and examined on 09/03/2023   Brief hospital course: [name] is a 68 year old female with history of hypertension, history of stroke, morbid obesity, poor dentition, who presents ED via EMS from home for chief concerns of altered mental status.  Vitals in the ED showed T of 98, respiration rate of 14, heart rate of 113, blood pressure 96/78, improved to 125/65, SpO2 of 93% on 3 L nasal cannula.  Serum sodium is 122, potassium 4.4, chloride 85, bicarb 21, BUN of 26, serum creatinine 1.30, EGFR 45, nonfasting blood glucose 594, WBC 13.7, hemoglobin of 15.8, platelets of 229.  Anion gap was elevated at 16.  Lactic acid was 5.6.  UA was negative for ketones, leukocytes, nitrates.  Blood cultures x 2 are in process.  ED treatment: Diflucan  150 mg p.o. one-time dose, insulin  10 units, cefepime , Flagyl , vancomycin , LR 1 L bolus, LR sepsis bolus.  LR infusion at 150 mL/h.  5/2: Vital stable.  Preliminary blood cultures with Enterococcus faecalis-broad-spectrum antibiotics are being switched with Unasyn .  ID was also consulted.  CBG now improved to 177 and gap has been closed, CO2 of 25 now.  Patient with no prior diagnosis of diabetes. Transitioning to basal and short-acting. Will repeat blood cultures tomorrow  Assessment and Plan: * Hyperosmolar hyperglycemic state (HHS) (HCC) Patient with new diagnosis of diabetes mellitus, admitted with significantly elevated blood glucose level above 500 with concern of hyperosmolar hyperglycemia, gap Was elevated on admission but quickly improved with IV hydration. Likely type 2 diabetes mellitus. Initially managed with insulin  infusion and now is being transitioned to basal and short-acting. Diabetes education consult -Currently on Semglee  20 units daily -Moderate SSI -Continue to monitor  Sepsis due to cellulitis  [facility]) Patient with lower abdominal cellulitis, met sepsis criteria with leukocytosis, tachycardia and tachypnea. Blood cultures growing Enterococcus faecalis. Initially received broad-spectrum antibiotics which are now being de-escalated to Unasyn . ID was also consulted - Repeat blood cultures tomorrow -Supportive care  Altered mental status Improved.  Likely multifactorial with hyperosmolar hyperglycemia and also concern of sepsis. - Continue to monitor  Chronic pain PDMP reviewed Current active prescriptions: oxycodone  15 mg written and filled on 08/26/23, 30 days supplied, 105 tablets (same monthly prescription filled since November 2024. Gabapentin  100 mg capsules, 90 days supplies, 360 tablets  Patient states that she does not take her pain medications every day Patient states she only takes it as needed for gout UDS positive for opioids  Essential hypertension Blood pressure currently within goal.  Initial home amlodipine was held for concern of sepsis.  We can resume if needed Hydralazine  5 mg IV every 6 hours as needed for SBP greater 175, 5 days with  Hyperosmolar hyponatremia Corrected serum sodium is 134 - Continue to monitor  Leukocytosis Improving. -Continue to monitor  Vaginal candidiasis With intertrigo Fluconazole  150 mg p.o. every 72 hours, to complete 3 doses  Morbid obesity (HCC) Estimated body mass index is 39.68 kg/m as calculated from the following:   Height as of this encounter: 5\' 2"  (1.575 m).   Weight as of this encounter: 98.4 kg.   This complicates overall care and prognosis.  Counseling was provided.      Subjective: Patient was seen and examined today.  She was feeling much improved.  Mentation seems to be at baseline.  She does not know that she has any diabetes.  Physical Exam: Vitals:   09/03/23 0900 09/03/23 1000 09/03/23 1100 09/03/23 1200  BP: (!) 106/52 (!) 114/57 (!) 126/55   Pulse: 73 79 77 93  Resp: 17 18 (!) 24 16  Temp:     98.5 F (36.9 C)  TempSrc:    Axillary  SpO2: 99% 100% 100% 90%  Weight:      Height:       General.  Obese lady, in no acute distress. Pulmonary.  Lungs clear bilaterally, normal respiratory effort. CV.  Regular rate and rhythm, no JVD, rub or murmur. Abdomen.  Soft, nontender, nondistended, BS positive.  Lower abdominal erythema CNS.  Alert and oriented .  No focal neurologic deficit. Extremities.  No edema, no cyanosis, pulses intact and symmetrical.   Data Reviewed: Prior data reviewed  Family Communication: Discussed with patient.  No contact number listed  Disposition: Status is: Inpatient Remains inpatient appropriate because: Severity of illness  Planned Discharge Destination: Home  DVT prophylaxis.  Subcu heparin  Time spent: 50 minutes  This record has been created using Conservation officer, historic buildings. Errors have been sought and corrected,but may not always be located. Such creation errors do not reflect on the standard of care.   Author: Luna Salinas, MD 09/03/2023 1:15 PM  For on call review [email or web address].

## 2023-09-03 NOTE — Inpatient Diabetes Management (Addendum)
 Inpatient Diabetes Program Recommendations  AACE/ADA: New Consensus Statement on Inpatient Glycemic Control   Target Ranges:  Prepandial:   less than 140 mg/dL      Peak postprandial:   less than 180 mg/dL (1-2 hours)      Critically ill patients:  140 - 180 mg/dL    Latest Reference Range & Units 09/03/23 02:08 09/03/23 03:12 09/03/23 04:17 09/03/23 05:29 09/03/23 06:31  Glucose-Capillary 70 - 99 mg/dL 161 (H) 096 (H) 045 (H) 220 (H) 197 (H)    Latest Reference Range & Units 09/02/23 15:28  Hemoglobin A1C 4.8 - 5.6 % 15.5 (H)    Latest Reference Range & Units 09/02/23 11:35  CO2 22 - 32 mmol/L 21 (L)  Glucose 70 - 99 mg/dL 409 (HH)  BUN 8 - 23 mg/dL 26 (H)  Creatinine 8.11 - 1.00 mg/dL 9.14 (H)  Calcium 8.9 - 10.3 mg/dL 78.2  Anion gap 5 - 15  16 (H)   Review of Glycemic Control  Diabetes history: No Outpatient Diabetes medications: NA Current orders for Inpatient glycemic control: IV insulin   Inpatient Diabetes Program Recommendations:    Insulin : Once provider is ready to transition from IV to SQ insulin , please consider ordering Semglee  20 units Q24H (based on 98.4 kg x 0.2 units), CBGs Q4H, and Novolog  0-15 units Q4H.  HbgA1C: A1C 15.5% on 09/02/23 indicating an average glucose of 398 mg/d over the past 2-3 months.  NOTE: Patient admitted with HHS, sepsis, altered mental status, fungal infection, and cellulitis. New DM dx this admission (per H&P, patient denies any DM hx). Initial lab glucose 594 mg/dl. Ordered: Living Well with DM book, RD consult for diet education, and patient education by bedside nursing. Will plan to see patient today regarding new DM dx.  Addendum 09/03/23@13 :45-Spoke with patient at bedside regarding new DM dx. Patient states that she has insurance and goes to [facility] every month. Patient states that she had blood work done a few months ago and she was not told if her glucose was elevated. Patient denies any prior hx of DM or preDM. Patient  reports her mother had preDM. Spoke with patient about new diabetes diagnosis.  Discussed A1C results (15.5% on 09/02/23) and explained what an A1C is and informed patient that current A1C indicates an average glucose of 398 mg/dl over the past 2-3 months. Discussed basic pathophysiology of DM Type 2, basic home care, importance of checking CBGs and maintaining good CBG control to prevent long-term and short-term complications. Reviewed glucose and A1C goals. Reviewed signs and symptoms of hyperglycemia and hypoglycemia along with treatment for both. Discussed impact of nutrition, exercise, stress, sickness, and medications on diabetes control. Reviewed Living Well with diabetes booklet and encouraged patient to read through entire book. Informed patient that she will likely be prescribed insulin  for outpatient DM control.  Started discussing insulin  and patient stated that she would like to wait and talk about insulin . She states her stomach is cramping badly and she needed to have bowel movement. NT came in and assisted with putting patient on bedpan  and gave patient some privacy.  Went back in room and patient states she would like to wait and talk about insulin . Explained that our team is not on campus over the weekend so I would like to review insulin . Patient stated again she would like to wait on insulin . Informed patient that nursing would be requested to review insulin  administration with her to help prepare in case she is discharged on  insulin .  Informed patient that RN will be asking her to self-administer insulin  to ensure proper technique and ability to administer self insulin  shots.   Patient verbalized understanding of information discussed and she states that she has no further questions at this time related to diabetes.   RNs to review insulin  administration with patient and provide ongoing basic DM education at bedside with this patient and engage patient to actively check blood glucose and  administer insulin  injections.   [facility] and spoke with Lauren (nurse) who reports that patient does not have any hx of DM. A1C was 6% in November 2024; glucose lab was not done at that time. Lauren confirms patient comes once a month and that she has not been prescribed any steroids recently.   Addendum 09/03/23@14 :40-Went back by room to discuss insulin  again. Patient is moaning and grimacing with stomach cramps and states she is constipated and feels bad. Patient still does not want to review insulin  at this time.  Thanks, Beacher Limerick, RN, MSN, CDCES Diabetes Coordinator Inpatient Diabetes Program [phone] (Team Pager from 8am to 5pm)

## 2023-09-03 NOTE — Progress Notes (Signed)
 PHARMACY - PHYSICIAN COMMUNICATION CRITICAL VALUE ALERT - BLOOD CULTURE IDENTIFICATION (BCID)  Results for orders placed or performed during the hospital encounter of 09/02/23  Blood Culture (routine x 2)     Status: None (Preliminary result)   Collection Time: 09/02/23 11:45 AM   Specimen: BLOOD  Result Value Ref Range Status   Specimen Description BLOOD BLOOD LEFT HAND  Final   Special Requests   Final    BOTTLES DRAWN AEROBIC AND ANAEROBIC Blood Culture results may not be optimal due to an inadequate volume of blood received in culture bottles   Culture  Setup Time   Final    Organism ID to follow GRAM POSITIVE COCCI AEROBIC BOTTLE ONLY CRITICAL RESULT CALLED TO, READ BACK BY AND VERIFIED WITH: Reida Hem BELEUE @0258  ON 09/03/23 SKL Performed at [facility], [address] [place], [place] [address]    Culture PENDING  Incomplete   Report Status PENDING  Incomplete  Blood Culture ID Panel (Reflexed)     Status: Abnormal   Collection Time: 09/02/23 11:45 AM  Result Value Ref Range Status   Enterococcus faecalis DETECTED (A) NOT DETECTED Final    Comment: CRITICAL RESULT CALLED TO, READ BACK BY AND VERIFIED WITH: Eathen Budreau BELEUE @0258  ON 09/03/23 SKL    Enterococcus Faecium NOT DETECTED NOT DETECTED Final   Listeria monocytogenes NOT DETECTED NOT DETECTED Final   Staphylococcus species NOT DETECTED NOT DETECTED Final   Staphylococcus aureus (BCID) NOT DETECTED NOT DETECTED Final   Staphylococcus epidermidis NOT DETECTED NOT DETECTED Final   Staphylococcus lugdunensis NOT DETECTED NOT DETECTED Final   Streptococcus species NOT DETECTED NOT DETECTED Final   Streptococcus agalactiae NOT DETECTED NOT DETECTED Final   Streptococcus pneumoniae NOT DETECTED NOT DETECTED Final   Streptococcus pyogenes NOT DETECTED NOT DETECTED Final   A.calcoaceticus-baumannii NOT DETECTED NOT DETECTED Final   Bacteroides fragilis NOT DETECTED NOT DETECTED Final   Enterobacterales NOT DETECTED NOT  DETECTED Final   Enterobacter cloacae complex NOT DETECTED NOT DETECTED Final   Escherichia coli NOT DETECTED NOT DETECTED Final   Klebsiella aerogenes NOT DETECTED NOT DETECTED Final   Klebsiella oxytoca NOT DETECTED NOT DETECTED Final   Klebsiella pneumoniae NOT DETECTED NOT DETECTED Final   Proteus species NOT DETECTED NOT DETECTED Final   Salmonella species NOT DETECTED NOT DETECTED Final   Serratia marcescens NOT DETECTED NOT DETECTED Final   Haemophilus influenzae NOT DETECTED NOT DETECTED Final   Neisseria meningitidis NOT DETECTED NOT DETECTED Final   Pseudomonas aeruginosa NOT DETECTED NOT DETECTED Final   Stenotrophomonas maltophilia NOT DETECTED NOT DETECTED Final   Candida albicans NOT DETECTED NOT DETECTED Final   Candida auris NOT DETECTED NOT DETECTED Final   Candida glabrata NOT DETECTED NOT DETECTED Final   Candida krusei NOT DETECTED NOT DETECTED Final   Candida parapsilosis NOT DETECTED NOT DETECTED Final   Candida tropicalis NOT DETECTED NOT DETECTED Final   Cryptococcus neoformans/gattii NOT DETECTED NOT DETECTED Final   Vancomycin  resistance NOT DETECTED NOT DETECTED Final    Comment: Performed at [facility], [address] [place], [place] [address]  Resp panel by RT-PCR (RSV, Flu A&B, Covid) Anterior Nasal Swab     Status: None   Collection Time: 09/02/23 12:17 PM   Specimen: Anterior Nasal Swab  Result Value Ref Range Status   SARS Coronavirus 2 by RT PCR NEGATIVE NEGATIVE Final    Comment: (NOTE) SARS-CoV-2 target nucleic acids are NOT DETECTED.  The SARS-CoV-2 RNA is generally detectable in  upper respiratory specimens during the acute phase of infection. The lowest concentration of SARS-CoV-2 viral copies this assay can detect is 138 copies/mL. A negative result does not preclude SARS-Cov-2 infection and should not be used as the sole basis for treatment or other patient management decisions. A negative result may occur with  improper  specimen collection/handling, submission of specimen other than nasopharyngeal swab, presence of viral mutation(s) within the areas targeted by this assay, and inadequate number of viral copies(<138 copies/mL). A negative result must be combined with clinical observations, patient history, and epidemiological information. The expected result is Negative.  Fact Sheet for Patients:  BloggerCourse.com  Fact Sheet for Healthcare Providers:  SeriousBroker.it  This test is no t yet approved or cleared by the [place]  FDA and  has been authorized for detection and/or diagnosis of SARS-CoV-2 by FDA under an Emergency Use Authorization (EUA). This EUA will remain  in effect (meaning this test can be used) for the duration of the COVID-19 declaration under Section 564(b)(1) of the Act, 21 U.S.C.section 360bbb-3(b)(1), unless the authorization is terminated  or revoked sooner.       Influenza A by PCR NEGATIVE NEGATIVE Final   Influenza B by PCR NEGATIVE NEGATIVE Final    Comment: (NOTE) The Xpert Xpress SARS-CoV-2/FLU/RSV plus assay is intended as an aid in the diagnosis of influenza from Nasopharyngeal swab specimens and should not be used as a sole basis for treatment. Nasal washings and aspirates are unacceptable for Xpert Xpress SARS-CoV-2/FLU/RSV testing.  Fact Sheet for Patients: BloggerCourse.com  Fact Sheet for Healthcare Providers: SeriousBroker.it  This test is not yet approved or cleared by the [place]  FDA and has been authorized for detection and/or diagnosis of SARS-CoV-2 by FDA under an Emergency Use Authorization (EUA). This EUA will remain in effect (meaning this test can be used) for the duration of the COVID-19 declaration under Section 564(b)(1) of the Act, 21 U.S.C. section 360bbb-3(b)(1), unless the authorization is terminated or revoked.     Resp  Syncytial Virus by PCR NEGATIVE NEGATIVE Final    Comment: (NOTE) Fact Sheet for Patients: BloggerCourse.com  Fact Sheet for Healthcare Providers: SeriousBroker.it  This test is not yet approved or cleared by the [place]  FDA and has been authorized for detection and/or diagnosis of SARS-CoV-2 by FDA under an Emergency Use Authorization (EUA). This EUA will remain in effect (meaning this test can be used) for the duration of the COVID-19 declaration under Section 564(b)(1) of the Act, 21 U.S.C. section 360bbb-3(b)(1), unless the authorization is terminated or revoked.  Performed at [facility], [address] [place], [place] [address]   MRSA Next Gen by PCR, Nasal     Status: None   Collection Time: 09/02/23  6:58 PM   Specimen: Nasal Mucosa; Nasal Swab  Result Value Ref Range Status   MRSA by PCR Next Gen NOT DETECTED NOT DETECTED Final    Comment: (NOTE) The GeneXpert MRSA Assay (FDA approved for NASAL specimens only), is one component of a comprehensive MRSA colonization surveillance program. It is not intended to diagnose MRSA infection nor to guide or monitor treatment for MRSA infections. Test performance is not FDA approved in patients less than 13 years old. Performed at [facility], [address] [place], [place] [address]     BCID Results: 1 (aerobic) of 4 bottles with Enterococcus faecalis, no resistance detected.  Pt currently on Cefepime  and Vancomycin .  Pt also on Fluconazole  (Vaginal candidiasis) x 3 doses.  Name of provider contacted: Rogue Clear, NP   Changes to prescribed antibiotics required: No changes at this time pending additional lab cx results.  Coretta Dexter, PharmD, [facility] 09/03/2023 3:03 AM

## 2023-09-03 NOTE — Plan of Care (Signed)
  Problem: Coping: Goal: Ability to adjust to condition or change in health will improve Outcome: Progressing   Problem: Health Behavior/Discharge Planning: Goal: Ability to manage health-related needs will improve Outcome: Progressing   Problem: Metabolic: Goal: Ability to maintain appropriate glucose levels will improve Outcome: Progressing   Problem: Fluid Volume: Goal: Ability to achieve a balanced intake and output will improve Outcome: Progressing

## 2023-09-03 NOTE — Consult Note (Addendum)
 NAME: Shelby Conway  DOB: 08/12/1955  MRN: [id number]  Date/Time: 09/03/2023 10:22 AM  REQUESTING PROVIDER: dr.Amin Subjective:  REASON FOR CONSULT: Enterococcus bacteremia. ? Shelby Conway is a 68 y.o. with a history of hypertension, CVA, morbid obesity, left TKA Patient was brought in by EMS for altered mental status beginning around 7 AM but worse over 2 hours.  When EMS arrived on the scene she was sitting on the toilet seat and family had told them that she had altered mental status.  They did ask stroke screen and it was negative.  Vitals on scene was BP of 153/78, pulse of 128, temperature 99.4 and respiratory rate of 26.  They also noted wound on the lower abdomen. In in the ED vitals BP of 96/78, temperature 98, heart rate 113, sats of 89%. WBC was 13.7, Hb 15.8, platelet 229, creatinine 1.30, and blood sugar was fine 94 with sodium of 122, total bili of 1.8.  The previous blood sugar in 2021 was 86.  Lactate was 5.6.  UA showed more than 500 glucose WBC was 0-5 Blood cultures were sent Patient was started on Vanco and cefepime  Patient was noted to have severe intertrigo in the pannus area and was started on fluconazole  as well. She was diagnosed with hyperosmolar Hei-Clean hyperglycemic status and aggressively treated with IV fluids, insulin  and started on appropriate treatment chest x-ray was normal One of the blood cultures came back positive for Enterococcus faecalis and I am seeing the patient.  Pt is feeling better today Lives with her son Has 2 dogs Self reliant   Past Medical History:  Diagnosis Date   Arthritis    CHF (congestive heart failure) (HCC)    Hypertension    Stroke [facility])     Past Surgical History:  Procedure Laterality Date   CARPAL TUNNEL RELEASE     CESAREAN SECTION     CHOLECYSTECTOMY     REPLACEMENT TOTAL KNEE      Social History   Socioeconomic History   Marital status: Divorced    Spouse name: Not on file   Number of children: Not on  file   Years of education: Not on file   Highest education level: Not on file  Occupational History   Not on file  Tobacco Use   Smoking status: Former   Smokeless tobacco: Never  Substance and Sexual Activity   Alcohol use: Never   Drug use: Never   Sexual activity: Not Currently  Other Topics Concern   Not on file  Social History Narrative   Not on file   Social Drivers of Health   Financial Resource Strain: Not on file  Food Insecurity: No Food Insecurity (09/02/2023)   Hunger Vital Sign    Worried About Running Out of Food in the Last Year: Never true    Ran Out of Food in the Last Year: Never true  Transportation Needs: No Transportation Needs (09/02/2023)   PRAPARE - Administrator, Civil Service (Medical): No    Lack of Transportation (Non-Medical): No  Physical Activity: Not on file  Stress: Not on file  Social Connections: Unknown (09/02/2023)   Social Connection and Isolation Panel [NHANES]    Frequency of Communication with Friends and Family: Three times a week    Frequency of Social Gatherings with Friends and Family: Three times a week    Attends Religious Services: Never    Active Member of Clubs or Organizations: Not on file  Attends Banker Meetings: Not on file    Marital Status: Not on file  Intimate Partner Violence: Not At Risk (09/02/2023)   Humiliation, Afraid, Rape, and Kick questionnaire    Fear of Current or Ex-Partner: No    Emotionally Abused: No    Physically Abused: No    Sexually Abused: No    Family History  Problem Relation Age of Onset   Hypertension Mother    Allergies  Allergen Reactions   Vicodin [Hydrocodone-Acetaminophen]    I? Current Facility-Administered Medications  Medication Dose Route Frequency Provider Last Rate Last Admin   Ampicillin -Sulbactam (UNASYN ) 3 g in sodium chloride  0.9 % 100 mL IVPB  3 g Intravenous Q6H Nahmir Zeidman, Merrie Abed, MD 200 mL/hr at 09/03/23 0950 3 g at 09/03/23 0950    Chlorhexidine  Gluconate Cloth 2 % PADS 6 each  6 each Topical Daily Cox, Amy N, DO   6 each at 09/02/23 1851   dextrose  5 %-0.9 % sodium chloride  infusion   Intravenous Continuous Elisabeth Guild, NP 125 mL/hr at 09/03/23 1191 Infusion Verify at 09/03/23 4782   dextrose  50 % solution 0-50 mL  0-50 mL Intravenous PRN Cox, Amy N, DO       [START ON 09/05/2023] fluconazole  (DIFLUCAN ) tablet 150 mg  150 mg Oral Q72H Cox, Amy N, DO       heparin  injection 5,000 Units  5,000 Units Subcutaneous Q8H Cox, Amy N, DO   5,000 Units at 09/03/23 9562   hydrALAZINE  (APRESOLINE ) injection 5 mg  5 mg Intravenous Q6H PRN Cox, Amy N, DO       insulin  aspart (novoLOG ) injection 0-15 Units  0-15 Units Subcutaneous TID WC Amin, Sumayya, MD       insulin  aspart (novoLOG ) injection 0-5 Units  0-5 Units Subcutaneous QHS Amin, Sumayya, MD       insulin  glargine-yfgn (SEMGLEE ) injection 20 Units  20 Units Subcutaneous Daily Amin, Sumayya, MD       insulin  regular, human (MYXREDLIN ) 100 units/ 100 mL infusion   Intravenous Continuous Cox, Amy N, DO 4.2 mL/hr at 09/03/23 0955 4.2 Units/hr at 09/03/23 1308   lactated ringers  infusion   Intravenous Continuous Cox, Amy N, DO   Stopped at 09/02/23 1721   nicotine  (NICODERM CQ  - dosed in mg/24 hours) patch 14 mg  14 mg Transdermal Daily Elisabeth Guild, NP   14 mg at 09/03/23 6578   nystatin  (MYCOSTATIN /NYSTOP ) topical powder   Topical TID Cox, Amy N, DO   Given at 09/03/23 4696     Abtx:  Anti-infectives (From admission, onward)    Start     Dose/Rate Route Frequency Ordered Stop   09/05/23 1200  fluconazole  (DIFLUCAN ) tablet 150 mg        150 mg Oral every 72 hours 09/02/23 1501 09/11/23 1159   09/03/23 1000  Ampicillin -Sulbactam (UNASYN ) 3 g in sodium chloride  0.9 % 100 mL IVPB        3 g 200 mL/hr over 30 Minutes Intravenous Every 6 hours 09/03/23 0824     09/03/23 0845  ampicillin  (OMNIPEN) 2 g in sodium chloride  0.9 % 100 mL IVPB  Status:  Discontinued        2 g 300  mL/hr over 20 Minutes Intravenous Every 4 hours 09/03/23 0757 09/03/23 0824   09/03/23 0000  ceFEPIme  (MAXIPIME ) 2 g in sodium chloride  0.9 % 100 mL IVPB  Status:  Discontinued        2 g 200 mL/hr over 30 Minutes Intravenous  Every 12 hours 09/02/23 1505 09/03/23 0757   09/02/23 1600  vancomycin  (VANCOCIN ) IVPB 1000 mg/200 mL premix        1,000 mg 200 mL/hr over 60 Minutes Intravenous  Once 09/02/23 1509 09/02/23 1710   09/02/23 1514  vancomycin  variable dose per unstable renal function (pharmacist dosing)  Status:  Discontinued         Does not apply See admin instructions 09/02/23 1514 09/03/23 0757   09/02/23 1200  ceFEPIme  (MAXIPIME ) 2 g in sodium chloride  0.9 % 100 mL IVPB        2 g 200 mL/hr over 30 Minutes Intravenous  Once 09/02/23 1150 09/02/23 1300   09/02/23 1200  metroNIDAZOLE  (FLAGYL ) IVPB 500 mg        500 mg 100 mL/hr over 60 Minutes Intravenous  Once 09/02/23 1150 09/02/23 1421   09/02/23 1200  vancomycin  (VANCOCIN ) IVPB 1000 mg/200 mL premix        1,000 mg 200 mL/hr over 60 Minutes Intravenous  Once 09/02/23 1150 09/02/23 1600   09/02/23 1200  fluconazole  (DIFLUCAN ) tablet 150 mg        150 mg Oral  Once 09/02/23 1150 09/02/23 1230       REVIEW OF SYSTEMS:  Const: negative fever, negative chills, negative weight loss Eyes: negative diplopia or visual changes, negative eye pain ENT: negative coryza, negative sore throat Resp: negative cough, hemoptysis, dyspnea Cards: negative for chest pain, palpitations, lower extremity edema GU: polyuria, no dysuria GI: Negative for abdominal pain, diarrhea, bleeding, constipation Skin:  rash  Heme: negative for easy bruising and gum/nose bleeding MS: weakness Neurolo:negative for headaches, dizziness, vertigo, memory problems  Psych: negative for feelings of anxiety, depression  Endocrine: negative for thyroid, diabetes Allergy/Immunology-vicodin Objective:  VITALS:  BP (!) 106/52   Pulse 73   Temp (!) 97.3 F (36.3  C) (Oral)   Resp 17   Ht 5\' 2"  (1.575 m)   Wt 98.4 kg   SpO2 99%   BMI 39.68 kg/m   PHYSICAL EXAM:  General: Alert, cooperative, no distress, appears stated age.  Head: Normocephalic, without obvious abnormality, atraumatic. Eyes: Conjunctivae clear, anicteric sclerae. Pupils are equal ENT Nares normal. No drainage or sinus tenderness. Lips, mucosa, and tongue normal. No Thrush Very poor dentition Neck: , symmetrical, no adenopathy, thyroid: non tender no carotid bruit and no JVD. Lungs: b/l air entry Heart: Regular rate and rhythm, no murmur, rub or gallop. Abdomen: Soft, non-tender,not distended. Bowel sounds normal. No masses Annus- under skin sever eintertrigo     Under breast intertrigo Extremities: atraumatic, no cyanosis. No edema. No clubbing Skin: as above Lymph: Cervical, supraclavicular normal. Neurologic: Grossly non-focal Pertinent Labs Lab Results CBC    Component Value Date/Time   WBC 13.0 (H) 09/03/2023 0220   RBC 4.47 09/03/2023 0220   HGB 13.5 09/03/2023 0220   HGB 13.7 05/27/2013 0943   HCT 39.1 09/03/2023 0220   HCT 40.7 05/27/2013 0943   PLT 134 (L) 09/03/2023 0220   PLT 246 05/27/2013 0943   MCV 87.5 09/03/2023 0220   MCV 88 05/27/2013 0943   MCH 30.2 09/03/2023 0220   MCHC 34.5 09/03/2023 0220   RDW 12.7 09/03/2023 0220   RDW 14.4 05/27/2013 0943   LYMPHSABS 1.7 09/02/2023 1135   MONOABS 1.9 (H) 09/02/2023 1135   EOSABS 0.0 09/02/2023 1135   BASOSABS 0.1 09/02/2023 1135       Latest Ref Rng & Units 09/03/2023    8:24 AM 09/03/2023  2:20 AM 09/02/2023    9:52 PM  CMP  Glucose 70 - 99 mg/dL 578  469  629   BUN 8 - 23 mg/dL 15  16  18    Creatinine 0.44 - 1.00 mg/dL 5.28  4.13  2.44   Sodium 135 - 145 mmol/L 129  127  127   Potassium 3.5 - 5.1 mmol/L 3.7  3.3  3.5   Chloride 98 - 111 mmol/L 96  93  92   CO2 22 - 32 mmol/L 25  28  28    Calcium 8.9 - 10.3 mg/dL 8.4  8.8  9.1       Microbiology: Recent Results (from the past 240  hours)  Blood Culture (routine x 2)     Status: None (Preliminary result)   Collection Time: 09/02/23 11:45 AM   Specimen: BLOOD  Result Value Ref Range Status   Specimen Description BLOOD BLOOD LEFT HAND  Final   Special Requests   Final    BOTTLES DRAWN AEROBIC AND ANAEROBIC Blood Culture results may not be optimal due to an inadequate volume of blood received in culture bottles   Culture  Setup Time   Final    Organism ID to follow GRAM POSITIVE COCCI AEROBIC BOTTLE ONLY CRITICAL RESULT CALLED TO, READ BACK BY AND VERIFIED WITH: NATHAN BELEUE @0258  ON 09/03/23 SKL Performed at [facility], [address] [place], [place] [address]    Culture GRAM POSITIVE COCCI  Final   Report Status PENDING  Incomplete  Blood Culture ID Panel (Reflexed)     Status: Abnormal   Collection Time: 09/02/23 11:45 AM  Result Value Ref Range Status   Enterococcus faecalis DETECTED (A) NOT DETECTED Final    Comment: CRITICAL RESULT CALLED TO, READ BACK BY AND VERIFIED WITH: NATHAN BELEUE @0258  ON 09/03/23 SKL    Enterococcus Faecium NOT DETECTED NOT DETECTED Final   Listeria monocytogenes NOT DETECTED NOT DETECTED Final   Staphylococcus species NOT DETECTED NOT DETECTED Final   Staphylococcus aureus (BCID) NOT DETECTED NOT DETECTED Final   Staphylococcus epidermidis NOT DETECTED NOT DETECTED Final   Staphylococcus lugdunensis NOT DETECTED NOT DETECTED Final   Streptococcus species NOT DETECTED NOT DETECTED Final   Streptococcus agalactiae NOT DETECTED NOT DETECTED Final   Streptococcus pneumoniae NOT DETECTED NOT DETECTED Final   Streptococcus pyogenes NOT DETECTED NOT DETECTED Final   A.calcoaceticus-baumannii NOT DETECTED NOT DETECTED Final   Bacteroides fragilis NOT DETECTED NOT DETECTED Final   Enterobacterales NOT DETECTED NOT DETECTED Final   Enterobacter cloacae complex NOT DETECTED NOT DETECTED Final   Escherichia coli NOT DETECTED NOT DETECTED Final   Klebsiella aerogenes NOT  DETECTED NOT DETECTED Final   Klebsiella oxytoca NOT DETECTED NOT DETECTED Final   Klebsiella pneumoniae NOT DETECTED NOT DETECTED Final   Proteus species NOT DETECTED NOT DETECTED Final   Salmonella species NOT DETECTED NOT DETECTED Final   Serratia marcescens NOT DETECTED NOT DETECTED Final   Haemophilus influenzae NOT DETECTED NOT DETECTED Final   Neisseria meningitidis NOT DETECTED NOT DETECTED Final   Pseudomonas aeruginosa NOT DETECTED NOT DETECTED Final   Stenotrophomonas maltophilia NOT DETECTED NOT DETECTED Final   Candida albicans NOT DETECTED NOT DETECTED Final   Candida auris NOT DETECTED NOT DETECTED Final   Candida glabrata NOT DETECTED NOT DETECTED Final   Candida krusei NOT DETECTED NOT DETECTED Final   Candida parapsilosis NOT DETECTED NOT DETECTED Final   Candida tropicalis NOT DETECTED NOT DETECTED Final   Cryptococcus neoformans/gattii NOT  DETECTED NOT DETECTED Final   Vancomycin  resistance NOT DETECTED NOT DETECTED Final    Comment: Performed at [facility], [address] [place], [place] [address]  Resp panel by RT-PCR (RSV, Flu A&B, Covid) Anterior Nasal Swab     Status: None   Collection Time: 09/02/23 12:17 PM   Specimen: Anterior Nasal Swab  Result Value Ref Range Status   SARS Coronavirus 2 by RT PCR NEGATIVE NEGATIVE Final    Comment: (NOTE) SARS-CoV-2 target nucleic acids are NOT DETECTED.  The SARS-CoV-2 RNA is generally detectable in upper respiratory specimens during the acute phase of infection. The lowest concentration of SARS-CoV-2 viral copies this assay can detect is 138 copies/mL. A negative result does not preclude SARS-Cov-2 infection and should not be used as the sole basis for treatment or other patient management decisions. A negative result may occur with  improper specimen collection/handling, submission of specimen other than nasopharyngeal swab, presence of viral mutation(s) within the areas targeted by this assay, and  inadequate number of viral copies(<138 copies/mL). A negative result must be combined with clinical observations, patient history, and epidemiological information. The expected result is Negative.  Fact Sheet for Patients:  BloggerCourse.com  Fact Sheet for Healthcare Providers:  SeriousBroker.it  This test is no t yet approved or cleared by the [place]  FDA and  has been authorized for detection and/or diagnosis of SARS-CoV-2 by FDA under an Emergency Use Authorization (EUA). This EUA will remain  in effect (meaning this test can be used) for the duration of the COVID-19 declaration under Section 564(b)(1) of the Act, 21 U.S.C.section 360bbb-3(b)(1), unless the authorization is terminated  or revoked sooner.       Influenza A by PCR NEGATIVE NEGATIVE Final   Influenza B by PCR NEGATIVE NEGATIVE Final    Comment: (NOTE) The Xpert Xpress SARS-CoV-2/FLU/RSV plus assay is intended as an aid in the diagnosis of influenza from Nasopharyngeal swab specimens and should not be used as a sole basis for treatment. Nasal washings and aspirates are unacceptable for Xpert Xpress SARS-CoV-2/FLU/RSV testing.  Fact Sheet for Patients: BloggerCourse.com  Fact Sheet for Healthcare Providers: SeriousBroker.it  This test is not yet approved or cleared by the [place]  FDA and has been authorized for detection and/or diagnosis of SARS-CoV-2 by FDA under an Emergency Use Authorization (EUA). This EUA will remain in effect (meaning this test can be used) for the duration of the COVID-19 declaration under Section 564(b)(1) of the Act, 21 U.S.C. section 360bbb-3(b)(1), unless the authorization is terminated or revoked.     Resp Syncytial Virus by PCR NEGATIVE NEGATIVE Final    Comment: (NOTE) Fact Sheet for Patients: BloggerCourse.com  Fact Sheet for Healthcare  Providers: SeriousBroker.it  This test is not yet approved or cleared by the [place]  FDA and has been authorized for detection and/or diagnosis of SARS-CoV-2 by FDA under an Emergency Use Authorization (EUA). This EUA will remain in effect (meaning this test can be used) for the duration of the COVID-19 declaration under Section 564(b)(1) of the Act, 21 U.S.C. section 360bbb-3(b)(1), unless the authorization is terminated or revoked.  Performed at [facility], [address] [place], [place] [address]   Blood Culture (routine x 2)     Status: None (Preliminary result)   Collection Time: 09/02/23 12:17 PM   Specimen: BLOOD  Result Value Ref Range Status   Specimen Description BLOOD BLOOD RIGHT FOREARM  Final   Special Requests   Final    BOTTLES  DRAWN AEROBIC AND ANAEROBIC Blood Culture adequate volume   Culture   Final    NO GROWTH < 24 HOURS Performed at [facility], [address] [place], [place] [address]    Report Status PENDING  Incomplete  MRSA Next Gen by PCR, Nasal     Status: None   Collection Time: 09/02/23  6:58 PM   Specimen: Nasal Mucosa; Nasal Swab  Result Value Ref Range Status   MRSA by PCR Next Gen NOT DETECTED NOT DETECTED Final    Comment: (NOTE) The GeneXpert MRSA Assay (FDA approved for NASAL specimens only), is one component of a comprehensive MRSA colonization surveillance program. It is not intended to diagnose MRSA infection nor to guide or monitor treatment for MRSA infections. Test performance is not FDA approved in patients less than 47 years old. Performed at [facility], [address] [place], [place] [address]     IMAGING RESULTS: Cxr no infiltrate I have personally reviewed the films ? Impression/Recommendation Hyperosmolar hyperglycemic presentation  new onset- management as per primary team  Hyponatremia  Dehydration Encephalopathy from the above   resolved ? Enterococcus bacteremia 1/2 culture- no obvious source- could be transient bacteremia from bowel pertubation no prosthetic valve, no  Risk for endocarditis is very low Will do unasyn   ( ampicillin  /sulbactam) a sit would cover for cellulitis if staph present DC vanco and cefepime  After 48 hrs of antibiotic repeat blood culture  AKI- resolved  Severe intertriginous candida with possible secondary cellulitis Increase fluconazole  200mg   every day x5-7 days Morbid obesity- ? check TSH   ? I have personally spent  -80 --minutes involved in face-to-face and non-face-to-face activities for this patient on the day of the visit. Professional time spent includes the following activities: Preparing to see the patient (review of tests), Obtaining and/or reviewing separately obtained history (admission/discharge record), Performing a medically appropriate examination and/or evaluation , Ordering medications/tests/procedures, referring and communicating with other health care professionals, Documenting clinical information in the EMR, Independently interpreting results (not separately reported), Communicating results to the patient/family/caregiver, Counseling and educating the patient/family/caregiver and Care coordination (not separately reported).  This involved complex antimicrobial management  ________________________________________________ Discussed with patient, nurse, pharmacists  and requesting provider  ID will not routinely see her this weekend On call ID available by phone for urgent questions

## 2023-09-04 DIAGNOSIS — E1100 Type 2 diabetes mellitus with hyperosmolarity without nonketotic hyperglycemic-hyperosmolar coma (NKHHC): Secondary | ICD-10-CM | POA: Diagnosis not present

## 2023-09-04 LAB — MAGNESIUM: Magnesium: 1.8 mg/dL (ref 1.7–2.4)

## 2023-09-04 LAB — CBC
HCT: 38.9 % (ref 36.0–46.0)
Hemoglobin: 13.2 g/dL (ref 12.0–15.0)
MCH: 30.2 pg (ref 26.0–34.0)
MCHC: 33.9 g/dL (ref 30.0–36.0)
MCV: 89 fL (ref 80.0–100.0)
Platelets: 111 10*3/uL — ABNORMAL LOW (ref 150–400)
RBC: 4.37 MIL/uL (ref 3.87–5.11)
RDW: 13.1 % (ref 11.5–15.5)
WBC: 8.1 10*3/uL (ref 4.0–10.5)
nRBC: 0 % (ref 0.0–0.2)

## 2023-09-04 LAB — GLUCOSE, CAPILLARY
Glucose-Capillary: 259 mg/dL — ABNORMAL HIGH (ref 70–99)
Glucose-Capillary: 276 mg/dL — ABNORMAL HIGH (ref 70–99)
Glucose-Capillary: 259 mg/dL — ABNORMAL HIGH (ref 70–99)
Glucose-Capillary: 305 mg/dL — ABNORMAL HIGH (ref 70–99)

## 2023-09-04 LAB — BASIC METABOLIC PANEL WITH GFR
Anion gap: 8 (ref 5–15)
BUN: 11 mg/dL (ref 8–23)
CO2: 25 mmol/L (ref 22–32)
Calcium: 8.2 mg/dL — ABNORMAL LOW (ref 8.9–10.3)
Chloride: 96 mmol/L — ABNORMAL LOW (ref 98–111)
Creatinine, Ser: 0.56 mg/dL (ref 0.44–1.00)
GFR, Estimated: >60 mL/min (ref >60)
Glucose, Bld: 244 mg/dL — ABNORMAL HIGH (ref 70–99)
Potassium: 3.8 mmol/L (ref 3.5–5.1)
Sodium: 129 mmol/L — ABNORMAL LOW (ref 135–145)

## 2023-09-04 LAB — PHOSPHORUS: Phosphorus: 2.1 mg/dL — ABNORMAL LOW (ref 2.5–4.6)

## 2023-09-04 MED ORDER — INSULIN GLARGINE-YFGN 100 UNIT/ML ~~LOC~~ SOLN
25.0000 [IU] | Freq: Every day | SUBCUTANEOUS | Status: DC
  Administered 2023-09-04 – 2023-09-05 (×2): 25 [IU] via SUBCUTANEOUS
  Filled 2023-09-04 (×2): qty 0.25

## 2023-09-04 MED ORDER — INSULIN ASPART 100 UNIT/ML IJ SOLN
4.0000 [IU] | Freq: Three times a day (TID) | INTRAMUSCULAR | Status: DC
  Administered 2023-09-04 – 2023-09-05 (×4): 4 [IU] via SUBCUTANEOUS
  Filled 2023-09-04 (×4): qty 1

## 2023-09-04 MED ORDER — POTASSIUM PHOSPHATES 15 MMOLE/5ML IV SOLN
30.0000 mmol | Freq: Once | INTRAVENOUS | Status: AC
  Administered 2023-09-04: 30 mmol via INTRAVENOUS
  Filled 2023-09-04: qty 10

## 2023-09-04 NOTE — Plan of Care (Signed)
  Problem: Education: Goal: Ability to describe self-care measures that may prevent or decrease complications (Diabetes Survival Skills Education) will improve Outcome: Progressing   Problem: Metabolic: Goal: Ability to maintain appropriate glucose levels will improve Outcome: Progressing   Problem: Nutritional: Goal: Maintenance of adequate nutrition will improve Outcome: Progressing   Problem: Cardiac: Goal: Ability to maintain an adequate cardiac output will improve Outcome: Progressing   Problem: Metabolic: Goal: Ability to maintain appropriate glucose levels will improve Outcome: Progressing

## 2023-09-04 NOTE — Plan of Care (Signed)
  Problem: Coping: Goal: Ability to adjust to condition or change in health will improve Outcome: Progressing   Problem: Fluid Volume: Goal: Ability to maintain a balanced intake and output will improve Outcome: Progressing   Problem: Nutritional: Goal: Maintenance of adequate nutrition will improve Outcome: Progressing   Problem: Cardiac: Goal: Ability to maintain an adequate cardiac output will improve Outcome: Progressing   Problem: Health Behavior/Discharge Planning: Goal: Ability to identify and utilize available resources and services will improve Outcome: Progressing

## 2023-09-04 NOTE — Progress Notes (Signed)
 PHARMACY - PHYSICIAN COMMUNICATION CRITICAL VALUE ALERT - BLOOD CULTURE IDENTIFICATION (BCID)  Shelby Conway is an 68 y.o. female who presented to [facility] on 09/02/2023 with a chief complaint of bacteremia.   Assessment: 1 out of 4 bottles, anaerobic with GPR. Per MD, plans to order repeat blood cultures.   Name of physician (or Provider) Contacted: Dr. Deena Farrier   Current antibiotics: Unasyn  3g IV q6h   Changes to prescribed antibiotics recommended:  Patient is on recommended antibiotics - No changes needed  Results for orders placed or performed during the hospital encounter of 09/02/23  Blood Culture ID Panel (Reflexed) (Collected: 09/02/2023 11:45 AM)  Result Value Ref Range   Enterococcus faecalis DETECTED (A) NOT DETECTED   Enterococcus Faecium NOT DETECTED NOT DETECTED   Listeria monocytogenes NOT DETECTED NOT DETECTED   Staphylococcus species NOT DETECTED NOT DETECTED   Staphylococcus aureus (BCID) NOT DETECTED NOT DETECTED   Staphylococcus epidermidis NOT DETECTED NOT DETECTED   Staphylococcus lugdunensis NOT DETECTED NOT DETECTED   Streptococcus species NOT DETECTED NOT DETECTED   Streptococcus agalactiae NOT DETECTED NOT DETECTED   Streptococcus pneumoniae NOT DETECTED NOT DETECTED   Streptococcus pyogenes NOT DETECTED NOT DETECTED   A.calcoaceticus-baumannii NOT DETECTED NOT DETECTED   Bacteroides fragilis NOT DETECTED NOT DETECTED   Enterobacterales NOT DETECTED NOT DETECTED   Enterobacter cloacae complex NOT DETECTED NOT DETECTED   Escherichia coli NOT DETECTED NOT DETECTED   Klebsiella aerogenes NOT DETECTED NOT DETECTED   Klebsiella oxytoca NOT DETECTED NOT DETECTED   Klebsiella pneumoniae NOT DETECTED NOT DETECTED   Proteus species NOT DETECTED NOT DETECTED   Salmonella species NOT DETECTED NOT DETECTED   Serratia marcescens NOT DETECTED NOT DETECTED   Haemophilus influenzae NOT DETECTED NOT DETECTED   Neisseria meningitidis NOT DETECTED NOT DETECTED    Pseudomonas aeruginosa NOT DETECTED NOT DETECTED   Stenotrophomonas maltophilia NOT DETECTED NOT DETECTED   Candida albicans NOT DETECTED NOT DETECTED   Candida auris NOT DETECTED NOT DETECTED   Candida glabrata NOT DETECTED NOT DETECTED   Candida krusei NOT DETECTED NOT DETECTED   Candida parapsilosis NOT DETECTED NOT DETECTED   Candida tropicalis NOT DETECTED NOT DETECTED   Cryptococcus neoformans/gattii NOT DETECTED NOT DETECTED   Vancomycin  resistance NOT DETECTED NOT DETECTED    Lovie Rudder, PharmD 09/04/2023 11:07 AM

## 2023-09-04 NOTE — Progress Notes (Signed)
 PROGRESS NOTE    Shelby Conway  ZOX:[id number] DOB: October 24, 1955 DOA: 09/02/2023 PCP: [facility]   Assessment & Plan:   Principal Problem:   Hyperosmolar hyperglycemic state (HHS) (HCC) Active Problems:   Sepsis due to cellulitis (HCC)   Altered mental status   Chronic pain   Essential hypertension   Hyperosmolar hyponatremia   Leukocytosis   Vaginal candidiasis   Morbid obesity (HCC)   At risk for polypharmacy  Assessment and Plan: Hyperosmolar hyperglycemic state: weaned off of insulin  drip. Continue on glargine, aspart, SSI w/ accuchecks. Secondary to poorly controlled DM2  DM2: new dx. Poorly controlled, HbA1c 15.5. Continue on glargine, aspart, SSI w/ accuchecks. Continue w/ DM education. Pt will need to d/c home on insulin    Bacteremia: blood cxs growing enterococcus faecalis, sens pending. Echo ordered. Continue on IV unasyn  as per ID. Possibly secondary to transient bacteremia from bowel pertubation as per ID. Repeat blood cxs NGTD   Sepsis: met criteria w/ leukocytosis, tachycardia, tachypnea & likely abd cellulitis. Continue on IV unasyn .   Acute encephalopathy: likely secondary to all above. Improved  Chronic pain: continue on home dose of oxy   HTN: BP is WNL currently. Holding home amlodipine   Hyperosmolar hyponatremia: stable from day prior. Will continue to monitor    Leukocytosis: resolved    Vaginal candidiasis: w/ intertrigo. Continue on fluconazole     Morbid obesity: BMI 40.6. Complicates overall care & prognosis   Thrombocytopenia: etiology unclear. Will continue to monitor   Hypophosphatemia: potassium phosphate ordered.    DVT prophylaxis: heparin   Code Status: full  Family Communication: discussed pt's care w/ pt's son, Donavon Fudge, and answered his questions Disposition Plan: likely d/c back home  Level of care: Stepdown  Status is: Inpatient Remains inpatient appropriate because: severity of illness    Consultants:   ID  Procedures:   Antimicrobials: unasyn     Subjective: Pt c/o fatigue   Objective: Vitals:   09/04/23 0400 09/04/23 0700 09/04/23 0800 09/04/23 0836  BP: (!) 121/50 (!) 121/53 131/63   Pulse: 79 73 79 78  Resp: 15 16 (!) 22 17  Temp: 98.8 F (37.1 C)   98 F (36.7 C)  TempSrc: Oral   Axillary  SpO2: 100% 98% 100% 99%  Weight:      Height:        Intake/Output Summary (Last 24 hours) at 09/04/2023 0851 Last data filed at 09/04/2023 0836 Gross per 24 hour  Intake 597.46 ml  Output 1475 ml  Net -877.54 ml   Filed Weights   09/02/23 1534 09/02/23 1845 09/04/23 0345  Weight: 105.7 kg 98.4 kg 100.7 kg    Examination:  General exam: Appears calm and comfortable  Respiratory system: Clear to auscultation. Respiratory effort normal. Cardiovascular system: S1 & S2 +. No  rubs, gallops or clicks. Gastrointestinal system: Abdomen is nondistended, soft and nontender. Normal bowel sounds heard. Central nervous system: Alert and oriented. Moves all extremities  Psychiatry: Judgement and insight appear normal. Mood & affect appropriate.     Data Reviewed: I have personally reviewed following labs and imaging studies  CBC: Recent Labs  Lab 09/02/23 1135 09/03/23 0220 09/04/23 0319  WBC 13.7* 13.0* 8.1  NEUTROABS 9.9*  --   --   HGB 15.8* 13.5 13.2  HCT 45.5 39.1 38.9  MCV 87.8 87.5 89.0  PLT 229 134* 111*   Basic Metabolic Panel: Recent Labs  Lab 09/02/23 1135 09/02/23 2152 09/03/23 0220 09/03/23 0824 09/04/23 0319  NA 122*  127* 127* 129* 129*  K 4.4 3.5 3.3* 3.7 3.8  CL 85* 92* 93* 96* 96*  CO2 21* 28 28 25 25   GLUCOSE 594* 177* 164* 206* 244*  BUN 26* 18 16 15 11   CREATININE 1.30* 0.77 0.70 0.62 0.56  CALCIUM 10.0 9.1 8.8* 8.4* 8.2*  MG  --  1.5* 2.6*  --  1.8  PHOS  --  1.3* 2.0*  --  2.1*   GFR: Estimated Creatinine Clearance: 74.7 mL/min (by C-G formula based on SCr of 0.56 mg/dL). Liver Function Tests: Recent Labs  Lab 09/02/23 1135  AST 38   ALT 29  ALKPHOS 98  BILITOT 1.8*  PROT 7.7  ALBUMIN 3.7   No results for input(s): "LIPASE", "AMYLASE" in the last 168 hours. No results for input(s): "AMMONIA" in the last 168 hours. Coagulation Profile: Recent Labs  Lab 09/02/23 1353  INR 1.3*   Cardiac Enzymes: No results for input(s): "CKTOTAL", "CKMB", "CKMBINDEX", "TROPONINI" in the last 168 hours. BNP (last 3 results) No results for input(s): "PROBNP" in the last 8760 hours. HbA1C: Recent Labs    09/02/23 1528  HGBA1C 15.5*   CBG: Recent Labs  Lab 09/03/23 0947 09/03/23 1050 09/03/23 1558 09/03/23 2317 09/04/23 0741  GLUCAP 177* 164* 291* 251* 259*   Lipid Profile: No results for input(s): "CHOL", "HDL", "LDLCALC", "TRIG", "CHOLHDL", "LDLDIRECT" in the last 72 hours. Thyroid Function Tests: No results for input(s): "TSH", "T4TOTAL", "FREET4", "T3FREE", "THYROIDAB" in the last 72 hours. Anemia Panel: No results for input(s): "VITAMINB12", "FOLATE", "FERRITIN", "TIBC", "IRON", "RETICCTPCT" in the last 72 hours. Sepsis Labs: Recent Labs  Lab 09/02/23 1149 09/02/23 1353  LATICACIDVEN 5.6* 3.4*    Recent Results (from the past 240 hours)  Blood Culture (routine x 2)     Status: None (Preliminary result)   Collection Time: 09/02/23 11:45 AM   Specimen: BLOOD  Result Value Ref Range Status   Specimen Description   Final    BLOOD BLOOD LEFT HAND Performed at [facility], [address], [place], [place] [address]    Special Requests   Final    BOTTLES DRAWN AEROBIC AND ANAEROBIC Blood Culture results may not be optimal due to an inadequate volume of blood received in culture bottles Performed at [facility], [address] [place], [place] [address]    Culture  Setup Time   Final    GRAM POSITIVE COCCI AEROBIC BOTTLE ONLY CRITICAL RESULT CALLED TO, READ BACK BY AND VERIFIED WITH: NATHAN BELEUE @0258  ON 09/03/23 SKL Performed at [facility], [address], [place],  [place] [address]    Culture GRAM POSITIVE COCCI  Final   Report Status PENDING  Incomplete  Blood Culture ID Panel (Reflexed)     Status: Abnormal   Collection Time: 09/02/23 11:45 AM  Result Value Ref Range Status   Enterococcus faecalis DETECTED (A) NOT DETECTED Final    Comment: CRITICAL RESULT CALLED TO, READ BACK BY AND VERIFIED WITH: NATHAN BELEUE @0258  ON 09/03/23 SKL    Enterococcus Faecium NOT DETECTED NOT DETECTED Final   Listeria monocytogenes NOT DETECTED NOT DETECTED Final   Staphylococcus species NOT DETECTED NOT DETECTED Final   Staphylococcus aureus (BCID) NOT DETECTED NOT DETECTED Final   Staphylococcus epidermidis NOT DETECTED NOT DETECTED Final   Staphylococcus lugdunensis NOT DETECTED NOT DETECTED Final   Streptococcus species NOT DETECTED NOT DETECTED Final   Streptococcus agalactiae NOT DETECTED NOT DETECTED Final   Streptococcus pneumoniae NOT DETECTED NOT  DETECTED Final   Streptococcus pyogenes NOT DETECTED NOT DETECTED Final   A.calcoaceticus-baumannii NOT DETECTED NOT DETECTED Final   Bacteroides fragilis NOT DETECTED NOT DETECTED Final   Enterobacterales NOT DETECTED NOT DETECTED Final   Enterobacter cloacae complex NOT DETECTED NOT DETECTED Final   Escherichia coli NOT DETECTED NOT DETECTED Final   Klebsiella aerogenes NOT DETECTED NOT DETECTED Final   Klebsiella oxytoca NOT DETECTED NOT DETECTED Final   Klebsiella pneumoniae NOT DETECTED NOT DETECTED Final   Proteus species NOT DETECTED NOT DETECTED Final   Salmonella species NOT DETECTED NOT DETECTED Final   Serratia marcescens NOT DETECTED NOT DETECTED Final   Haemophilus influenzae NOT DETECTED NOT DETECTED Final   Neisseria meningitidis NOT DETECTED NOT DETECTED Final   Pseudomonas aeruginosa NOT DETECTED NOT DETECTED Final   Stenotrophomonas maltophilia NOT DETECTED NOT DETECTED Final   Candida albicans NOT DETECTED NOT DETECTED Final   Candida auris NOT DETECTED NOT DETECTED Final   Candida glabrata NOT  DETECTED NOT DETECTED Final   Candida krusei NOT DETECTED NOT DETECTED Final   Candida parapsilosis NOT DETECTED NOT DETECTED Final   Candida tropicalis NOT DETECTED NOT DETECTED Final   Cryptococcus neoformans/gattii NOT DETECTED NOT DETECTED Final   Vancomycin  resistance NOT DETECTED NOT DETECTED Final    Comment: Performed at [facility], [address] [place], [place] [address]  Resp panel by RT-PCR (RSV, Flu A&B, Covid) Anterior Nasal Swab     Status: None   Collection Time: 09/02/23 12:17 PM   Specimen: Anterior Nasal Swab  Result Value Ref Range Status   SARS Coronavirus 2 by RT PCR NEGATIVE NEGATIVE Final    Comment: (NOTE) SARS-CoV-2 target nucleic acids are NOT DETECTED.  The SARS-CoV-2 RNA is generally detectable in upper respiratory specimens during the acute phase of infection. The lowest concentration of SARS-CoV-2 viral copies this assay can detect is 138 copies/mL. A negative result does not preclude SARS-Cov-2 infection and should not be used as the sole basis for treatment or other patient management decisions. A negative result may occur with  improper specimen collection/handling, submission of specimen other than nasopharyngeal swab, presence of viral mutation(s) within the areas targeted by this assay, and inadequate number of viral copies(<138 copies/mL). A negative result must be combined with clinical observations, patient history, and epidemiological information. The expected result is Negative.  Fact Sheet for Patients:  BloggerCourse.com  Fact Sheet for Healthcare Providers:  SeriousBroker.it  This test is no t yet approved or cleared by the [place]  FDA and  has been authorized for detection and/or diagnosis of SARS-CoV-2 by FDA under an Emergency Use Authorization (EUA). This EUA will remain  in effect (meaning this test can be used) for the duration of the COVID-19 declaration under  Section 564(b)(1) of the Act, 21 U.S.C.section 360bbb-3(b)(1), unless the authorization is terminated  or revoked sooner.       Influenza A by PCR NEGATIVE NEGATIVE Final   Influenza B by PCR NEGATIVE NEGATIVE Final    Comment: (NOTE) The Xpert Xpress SARS-CoV-2/FLU/RSV plus assay is intended as an aid in the diagnosis of influenza from Nasopharyngeal swab specimens and should not be used as a sole basis for treatment. Nasal washings and aspirates are unacceptable for Xpert Xpress SARS-CoV-2/FLU/RSV testing.  Fact Sheet for Patients: BloggerCourse.com  Fact Sheet for Healthcare Providers: SeriousBroker.it  This test is not yet approved or cleared by the [place]  FDA and has been authorized for detection and/or diagnosis of SARS-CoV-2 by FDA under  an Emergency Use Authorization (EUA). This EUA will remain in effect (meaning this test can be used) for the duration of the COVID-19 declaration under Section 564(b)(1) of the Act, 21 U.S.C. section 360bbb-3(b)(1), unless the authorization is terminated or revoked.     Resp Syncytial Virus by PCR NEGATIVE NEGATIVE Final    Comment: (NOTE) Fact Sheet for Patients: BloggerCourse.com  Fact Sheet for Healthcare Providers: SeriousBroker.it  This test is not yet approved or cleared by the [place]  FDA and has been authorized for detection and/or diagnosis of SARS-CoV-2 by FDA under an Emergency Use Authorization (EUA). This EUA will remain in effect (meaning this test can be used) for the duration of the COVID-19 declaration under Section 564(b)(1) of the Act, 21 U.S.C. section 360bbb-3(b)(1), unless the authorization is terminated or revoked.  Performed at [facility], [address] [place], [place] [address]   Blood Culture (routine x 2)     Status: None (Preliminary result)   Collection Time: 09/02/23 12:17  PM   Specimen: BLOOD  Result Value Ref Range Status   Specimen Description BLOOD BLOOD RIGHT FOREARM  Final   Special Requests   Final    BOTTLES DRAWN AEROBIC AND ANAEROBIC Blood Culture adequate volume   Culture   Final    NO GROWTH 2 DAYS Performed at [facility], [address] [place], [place] [address]    Report Status PENDING  Incomplete  MRSA Next Gen by PCR, Nasal     Status: None   Collection Time: 09/02/23  6:58 PM   Specimen: Nasal Mucosa; Nasal Swab  Result Value Ref Range Status   MRSA by PCR Next Gen NOT DETECTED NOT DETECTED Final    Comment: (NOTE) The GeneXpert MRSA Assay (FDA approved for NASAL specimens only), is one component of a comprehensive MRSA colonization surveillance program. It is not intended to diagnose MRSA infection nor to guide or monitor treatment for MRSA infections. Test performance is not FDA approved in patients less than 22 years old. Performed at [facility], [address] [place], [place] [address]   Culture, blood (Routine X 2) w Reflex to ID Panel     Status: None (Preliminary result)   Collection Time: 09/03/23 11:33 PM   Specimen: BLOOD  Result Value Ref Range Status   Specimen Description BLOOD RIGHT ARM  Final   Special Requests   Final    BOTTLES DRAWN AEROBIC AND ANAEROBIC Blood Culture adequate volume   Culture   Final    NO GROWTH < 12 HOURS Performed at [facility], [address] [place], [place] [address]    Report Status PENDING  Incomplete  Culture, blood (Routine X 2) w Reflex to ID Panel     Status: None (Preliminary result)   Collection Time: 09/03/23 11:41 PM   Specimen: BLOOD  Result Value Ref Range Status   Specimen Description BLOOD LEFT ARM  Final   Special Requests   Final    BOTTLES DRAWN AEROBIC ONLY Blood Culture results may not be optimal due to an inadequate volume of blood received in culture bottles   Culture   Final    NO GROWTH < 12 HOURS Performed at [facility], [address], [place], [place] [address]    Report Status PENDING  Incomplete         Radiology Studies: DG Chest Port 1 View Result Date: 09/02/2023 CLINICAL DATA:  Altered mental status. EXAM: PORTABLE CHEST 1 VIEW COMPARISON:  January 28, 2020. FINDINGS: Stable cardiomediastinal silhouette. Both lungs are clear. The visualized skeletal structures are unremarkable. IMPRESSION: No active disease. Electronically Signed   By: Rosalene Colon M.D.   On: 09/02/2023 14:02        Scheduled Meds:  vitamin C  500 mg Oral BID   Chlorhexidine  Gluconate Cloth  6 each Topical Daily   fluconazole   200 mg Oral Daily   heparin   5,000 Units Subcutaneous Q8H   insulin  aspart  0-15 Units Subcutaneous TID WC   insulin  aspart  0-5 Units Subcutaneous QHS   insulin  glargine-yfgn  20 Units Subcutaneous Daily   multivitamin with minerals  1 tablet Oral Daily   nicotine   14 mg Transdermal Daily   nystatin    Topical TID   polyethylene glycol  17 g Oral Daily   Ensure Max Protein  11 oz Oral BID   thiamine   100 mg Oral Daily   Continuous Infusions:  ampicillin -sulbactam (UNASYN ) IV 3 g (09/04/23 0533)   insulin  4.4 Units/hr (09/03/23 1200)     LOS: 2 days       Alphonsus Jeans, MD [facility] Pager [phone]  If 7PM-7AM, please contact night-coverage [email or web address] 09/04/2023, 8:51 AM

## 2023-09-04 NOTE — Inpatient Diabetes Management (Signed)
 Inpatient Diabetes Program Recommendations  AACE/ADA: New Consensus Statement on Inpatient Glycemic Control (2015)  Target Ranges:  Prepandial:   less than 140 mg/dL      Peak postprandial:   less than 180 mg/dL (1-2 hours)      Critically ill patients:  140 - 180 mg/dL   Lab Results  Component Value Date   GLUCAP 259 (H) 09/04/2023   HGBA1C 15.5 (H) 09/02/2023    Latest Reference Range & Units 09/03/23 09:47 09/03/23 10:50 09/03/23 15:58 09/03/23 23:17 09/04/23 07:41  Glucose-Capillary 70 - 99 mg/dL 829 (H) 562 (H) 130 (H) 251 (H) 259 (H)  (H): Data is abnormally high  Diabetes history: No Outpatient Diabetes medications: NA Current orders for Inpatient glycemic control Semglee  20 units daily, Novolog  0-15 units tid, 0-5 units hs  Inpatient Diabetes Program Recommendations:   Please consider: -Increase Semglee  to 25 units daily -Add Novolog  4 units tid meal coverage if eats 50%  Thank you, Marily Shows E. Malakhai Beitler, RN, MSN, CDCES  Diabetes Coordinator Inpatient Glycemic Control Team Team Pager [phone] (8am-5pm) 09/04/2023 9:21 AM

## 2023-09-05 DIAGNOSIS — E1100 Type 2 diabetes mellitus with hyperosmolarity without nonketotic hyperglycemic-hyperosmolar coma (NKHHC): Secondary | ICD-10-CM | POA: Diagnosis not present

## 2023-09-05 LAB — PHOSPHORUS: Phosphorus: 2.7 mg/dL (ref 2.5–4.6)

## 2023-09-05 LAB — CBC
HCT: 36.7 % (ref 36.0–46.0)
Hemoglobin: 12.4 g/dL (ref 12.0–15.0)
MCH: 30.1 pg (ref 26.0–34.0)
MCHC: 33.8 g/dL (ref 30.0–36.0)
MCV: 89.1 fL (ref 80.0–100.0)
Platelets: 117 10*3/uL — ABNORMAL LOW (ref 150–400)
RBC: 4.12 MIL/uL (ref 3.87–5.11)
RDW: 13.1 % (ref 11.5–15.5)
WBC: 7 10*3/uL (ref 4.0–10.5)
nRBC: 0 % (ref 0.0–0.2)

## 2023-09-05 LAB — BASIC METABOLIC PANEL WITH GFR
Anion gap: 9 (ref 5–15)
BUN: 15 mg/dL (ref 8–23)
CO2: 26 mmol/L (ref 22–32)
Calcium: 8.2 mg/dL — ABNORMAL LOW (ref 8.9–10.3)
Chloride: 94 mmol/L — ABNORMAL LOW (ref 98–111)
Creatinine, Ser: 0.59 mg/dL (ref 0.44–1.00)
GFR, Estimated: >60 mL/min (ref >60)
Glucose, Bld: 277 mg/dL — ABNORMAL HIGH (ref 70–99)
Potassium: 4.1 mmol/L (ref 3.5–5.1)
Sodium: 129 mmol/L — ABNORMAL LOW (ref 135–145)

## 2023-09-05 LAB — GLUCOSE, CAPILLARY
Glucose-Capillary: 271 mg/dL — ABNORMAL HIGH (ref 70–99)
Glucose-Capillary: 255 mg/dL — ABNORMAL HIGH (ref 70–99)
Glucose-Capillary: 370 mg/dL — ABNORMAL HIGH (ref 70–99)
Glucose-Capillary: 338 mg/dL — ABNORMAL HIGH (ref 70–99)
Glucose-Capillary: 239 mg/dL — ABNORMAL HIGH (ref 70–99)

## 2023-09-05 LAB — MAGNESIUM: Magnesium: 1.7 mg/dL (ref 1.7–2.4)

## 2023-09-05 MED ORDER — INSULIN ASPART 100 UNIT/ML IJ SOLN
8.0000 [IU] | Freq: Three times a day (TID) | INTRAMUSCULAR | Status: DC
  Administered 2023-09-05: 8 [IU] via SUBCUTANEOUS
  Filled 2023-09-05: qty 1

## 2023-09-05 MED ORDER — OXYCODONE HCL 5 MG PO TABS
15.0000 mg | ORAL_TABLET | Freq: Four times a day (QID) | ORAL | Status: DC | PRN
  Administered 2023-09-05 – 2023-09-10 (×15): 15 mg via ORAL
  Filled 2023-09-05 (×15): qty 3

## 2023-09-05 MED ORDER — ONDANSETRON HCL 4 MG/2ML IJ SOLN
4.0000 mg | INTRAMUSCULAR | Status: DC | PRN
Start: 2023-09-05 — End: 2023-09-10
  Administered 2023-09-06: 4 mg via INTRAVENOUS
  Filled 2023-09-05: qty 2

## 2023-09-05 MED ORDER — INSULIN GLARGINE-YFGN 100 UNIT/ML ~~LOC~~ SOLN
30.0000 [IU] | Freq: Every day | SUBCUTANEOUS | Status: DC
  Administered 2023-09-06: 30 [IU] via SUBCUTANEOUS
  Filled 2023-09-05 (×2): qty 0.3

## 2023-09-05 MED ORDER — MORPHINE SULFATE (PF) 2 MG/ML IV SOLN: 1.0000 mg | INTRAVENOUS | Status: DC | PRN

## 2023-09-05 NOTE — Inpatient Diabetes Management (Signed)
 Inpatient Diabetes Program Recommendations  AACE/ADA: New Consensus Statement on Inpatient Glycemic Control (2015)  Target Ranges:  Prepandial:   less than 140 mg/dL      Peak postprandial:   less than 180 mg/dL (1-2 hours)      Critically ill patients:  140 - 180 mg/dL   Lab Results  Component Value Date   GLUCAP 370 (H) 09/05/2023   HGBA1C 15.5 (H) 09/02/2023    Outpatient Diabetes medications: NA Current orders for Inpatient glycemic control Semglee  25 units daily, Novolog   4 units tid meal coverage, Novolog  0-15 units tid, 0-5 units hs  Inpatient Diabetes Program Recommendations:   Please consider: -Increase Semglee  to 30 units daily -ncrease Novolog  8 units tid meal coverage if eats 50%  Thank you, Marily Shows E. Chantay Whitelock, RN, MSN, CDCES  Diabetes Coordinator Inpatient Glycemic Control Team Team Pager [phone] (8am-5pm) 09/05/2023 2:06 PM

## 2023-09-05 NOTE — TOC Initial Note (Signed)
 Transition of Care [facility]) - Initial/Assessment Note    Patient Details  Name: Shelby Conway MRN: [id number] Date of Birth: 02/18/1956  Transition [facility]) CM/SW Contact:    Alexandra Ice, RN Phone Number: 09/05/2023, 8:40 AM  Clinical Narrative:                  Transition of Care [facility]) - Inpatient Brief Assessment   Patient Details  Name: Shelby Conway MRN: [id number] Date of Birth: 1955-07-04  Transition of Care [facility]) CM/SW Contact:    Alexandra Ice, RN Phone Number: 09/05/2023, 8:41 AM   Clinical Narrative: Patient lives with son. Has PCP. Family assists as needed. ID consulted, managing IV ABX, patient with 1/2 positive blood cultures, repeat cultures pending. TOC will continue to monitor for potential discharge needs   Transition of Care Asessment: Insurance and Status: Insurance coverage has been reviewed Patient has primary care physician: Yes Home environment has been reviewed: Live with son Prior level of function:: Family assist as needed Prior/Current Home Services: No current home services Social Drivers of Health Review: SDOH reviewed no interventions necessary Readmission risk has been reviewed: Yes Transition of care needs: transition of care needs identified, TOC will continue to follow        Patient Goals and CMS Choice            Expected Discharge Plan and Services                                              Prior Living Arrangements/Services                       Activities of Daily Living   ADL Screening (condition at time of admission) Independently performs ADLs?: Yes (appropriate for developmental age) Is the patient deaf or have difficulty hearing?: No Does the patient have difficulty seeing, even when wearing glasses/contacts?: No Does the patient have difficulty concentrating, remembering, or making decisions?: No  Permission Sought/Granted                  Emotional  Assessment              Admission diagnosis:  Cellulitis, unspecified cellulitis site [L03.90] Sepsis, due to unspecified organism, unspecified whether acute organ dysfunction present (HCC) [A41.9] Hyperosmolar hyperglycemic state (HHS) (HCC) [E11.00] Patient Active Problem List   Diagnosis Date Noted   Hyperosmolar hyperglycemic state (HHS) (HCC) 09/02/2023   Sepsis due to cellulitis (HCC) 09/02/2023   Leukocytosis 09/02/2023   Altered mental status 09/02/2023   Hyperosmolar hyponatremia 09/02/2023   Chronic pain 09/02/2023   At risk for polypharmacy 09/02/2023   Vaginal candidiasis 09/02/2023   Morbid obesity (HCC) 09/02/2023   Essential hypertension 09/02/2023   Hyperglycemia 09/02/2023   PCP:  [facility]:   [facility] [phone] [place], [place] - [address] [place] [address] Phone: [phone] Fax: [phone]  [facility] - [place], [place] - [address] [place] [address] Phone: [phone] Fax: [phone]     Social Drivers of Health (SDOH) Social History: SDOH Screenings   Food Insecurity: No Food Insecurity (09/02/2023)  Housing: Low Risk  (09/05/2023)  Transportation Needs: No Transportation Needs (09/02/2023)  Utilities: Not At Risk (09/02/2023)  Social Connections: Patient Declined (09/05/2023)  Tobacco  Use: Medium Risk (09/02/2023)   SDOH Interventions:     Readmission Risk Interventions     No data to display

## 2023-09-05 NOTE — Care Management Important Message (Signed)
 Important Message  Patient Details  Name: Shelby Conway MRN: [id number] Date of Birth: Sep 27, 1955   Important Message Given:  Yes - Medicare IM     Anise Kerns 09/05/2023, 1:31 PM

## 2023-09-05 NOTE — Plan of Care (Signed)
  Problem: Coping: Goal: Ability to adjust to condition or change in health will improve Outcome: Progressing   Problem: Fluid Volume: Goal: Ability to maintain a balanced intake and output will improve Outcome: Progressing   Problem: Skin Integrity: Goal: Risk for impaired skin integrity will decrease Outcome: Progressing   Problem: Tissue Perfusion: Goal: Adequacy of tissue perfusion will improve Outcome: Progressing   Problem: Cardiac: Goal: Ability to maintain an adequate cardiac output will improve Outcome: Progressing   Problem: Respiratory: Goal: Will regain and/or maintain adequate ventilation Outcome: Progressing   Problem: Urinary Elimination: Goal: Ability to achieve and maintain adequate renal perfusion and functioning will improve Outcome: Progressing   Problem: Clinical Measurements: Goal: Will remain free from infection Outcome: Progressing Goal: Respiratory complications will improve Outcome: Progressing   Problem: Activity: Goal: Risk for activity intolerance will decrease Outcome: Progressing   Problem: Coping: Goal: Level of anxiety will decrease Outcome: Progressing   Problem: Elimination: Goal: Will not experience complications related to bowel motility Outcome: Progressing   Problem: Pain Managment: Goal: General experience of comfort will improve and/or be controlled Outcome: Progressing   Problem: Safety: Goal: Ability to remain free from injury will improve Outcome: Progressing   Problem: Skin Integrity: Goal: Risk for impaired skin integrity will decrease Outcome: Progressing

## 2023-09-05 NOTE — Progress Notes (Signed)
 PROGRESS NOTE    Shelby Conway  KGM:[id number] DOB: 10-Jan-1956 DOA: 09/02/2023 PCP: [facility]   Assessment & Plan:   Principal Problem:   Hyperosmolar hyperglycemic state (HHS) (HCC) Active Problems:   Sepsis due to cellulitis (HCC)   Altered mental status   Chronic pain   Essential hypertension   Hyperosmolar hyponatremia   Leukocytosis   Vaginal candidiasis   Morbid obesity (HCC)   At risk for polypharmacy  Assessment and Plan: Hyperosmolar hyperglycemic state: weaned off of insulin  drip. Continue on glargine, aspart, SSI w/ accuchecks. Secondary to poorly controlled DM2. Resolved.  DM2: new dx. Poorly controlled, HbA1c 15.5. Continue on glargine, aspart, SSI w/ accuchecks. Continue w/ DM education. Pt will need to d/c home on insulin    Bacteremia: blood cxs growing enterococcus faecalis. Echo ordered. Continue on IV unasyn  as per ID. Possibly secondary to transient bacteremia from bowel pertubation as per ID. Repeat blood cxs NGTD  Sepsis: met criteria w/ leukocytosis, tachycardia, tachypnea & likely abd cellulitis. Continue on IV unasyn . Resolved   Acute encephalopathy: likely secondary to all above. Likely close to baseline   Chronic pain: continue on home dose of oxy   HTN: BP is WNL. Holding home dose of amlodipine    Hyperosmolar hyponatremia: stable. Will continue to monitor     Leukocytosis: resolved    Vaginal candidiasis: w/ intertrigo. Continue on fluconazole     Morbid obesity: BMI 40.6. Complicates overall care & prognosis   Thrombocytopenia: etiology unclear. Labile   Hypophosphatemia: resolved    DVT prophylaxis: heparin   Code Status: full  Family Communication:  Disposition Plan: likely d/c back home  Level of care: Med-Surg  Status is: Inpatient Remains inpatient appropriate because: severity of illness    Consultants:  ID  Procedures:   Antimicrobials: unasyn     Subjective: Pt c/o back pain    Objective: Vitals:   09/04/23 2100 09/04/23 2200 09/04/23 2300 09/04/23 2333  BP:  123/65  130/62  Pulse: 85 84 86 86  Resp: (!) 21 17 19 20   Temp:    98.1 F (36.7 C)  TempSrc:      SpO2: 94% 97% 97% 98%  Weight:      Height:        Intake/Output Summary (Last 24 hours) at 09/05/2023 0757 Last data filed at 09/05/2023 0736 Gross per 24 hour  Intake 1198.33 ml  Output 1800 ml  Net -601.67 ml   Filed Weights   09/02/23 1534 09/02/23 1845 09/04/23 0345  Weight: 105.7 kg 98.4 kg 100.7 kg    Examination:  General exam: Appears calm but uncomfortable  Respiratory system: decreased breath sounds b/l  Cardiovascular system: S1/S2+. No rubs or clicks  Gastrointestinal system: abd is soft, NT, obese & hypoactive bowel sounds  Central nervous system: alert & oriented. Moves all extremities  Psychiatry: Judgement and insight appears at baseline. Appropriate mood and affect    Data Reviewed: I have personally reviewed following labs and imaging studies  CBC: Recent Labs  Lab 09/02/23 1135 09/03/23 0220 09/04/23 0319 09/05/23 0309  WBC 13.7* 13.0* 8.1 7.0  NEUTROABS 9.9*  --   --   --   HGB 15.8* 13.5 13.2 12.4  HCT 45.5 39.1 38.9 36.7  MCV 87.8 87.5 89.0 89.1  PLT 229 134* 111* 117*   Basic Metabolic Panel: Recent Labs  Lab 09/02/23 2152 09/03/23 0220 09/03/23 0824 09/04/23 0319 09/05/23 0309  NA 127* 127* 129* 129* 129*  K 3.5 3.3* 3.7 3.8 4.1  CL 92* 93* 96* 96* 94*  CO2 28 28 25 25 26   GLUCOSE 177* 164* 206* 244* 277*  BUN 18 16 15 11 15   CREATININE 0.77 0.70 0.62 0.56 0.59  CALCIUM 9.1 8.8* 8.4* 8.2* 8.2*  MG 1.5* 2.6*  --  1.8 1.7  PHOS 1.3* 2.0*  --  2.1* 2.7   GFR: Estimated Creatinine Clearance: 74.7 mL/min (by C-G formula based on SCr of 0.59 mg/dL). Liver Function Tests: Recent Labs  Lab 09/02/23 1135  AST 38  ALT 29  ALKPHOS 98  BILITOT 1.8*  PROT 7.7  ALBUMIN 3.7   No results for input(s): "LIPASE", "AMYLASE" in the last 168  hours. No results for input(s): "AMMONIA" in the last 168 hours. Coagulation Profile: Recent Labs  Lab 09/02/23 1353  INR 1.3*   Cardiac Enzymes: No results for input(s): "CKTOTAL", "CKMB", "CKMBINDEX", "TROPONINI" in the last 168 hours. BNP (last 3 results) No results for input(s): "PROBNP" in the last 8760 hours. HbA1C: Recent Labs    09/02/23 1528  HGBA1C 15.5*   CBG: Recent Labs  Lab 09/04/23 0741 09/04/23 1128 09/04/23 1621 09/04/23 2139 09/05/23 0729  GLUCAP 259* 276* 259* 305* 271*   Lipid Profile: No results for input(s): "CHOL", "HDL", "LDLCALC", "TRIG", "CHOLHDL", "LDLDIRECT" in the last 72 hours. Thyroid Function Tests: No results for input(s): "TSH", "T4TOTAL", "FREET4", "T3FREE", "THYROIDAB" in the last 72 hours. Anemia Panel: No results for input(s): "VITAMINB12", "FOLATE", "FERRITIN", "TIBC", "IRON", "RETICCTPCT" in the last 72 hours. Sepsis Labs: Recent Labs  Lab 09/02/23 1149 09/02/23 1353  LATICACIDVEN 5.6* 3.4*    Recent Results (from the past 240 hours)  Blood Culture (routine x 2)     Status: Abnormal (Preliminary result)   Collection Time: 09/02/23 11:45 AM   Specimen: BLOOD  Result Value Ref Range Status   Specimen Description   Final    BLOOD BLOOD LEFT HAND Performed at [facility], [address], [place], [place] [address]    Special Requests   Final    BOTTLES DRAWN AEROBIC AND ANAEROBIC Blood Culture results may not be optimal due to an inadequate volume of blood received in culture bottles Performed at [facility], [address] [place], [place] [address]    Culture  Setup Time   Final    GRAM POSITIVE COCCI AEROBIC BOTTLE ONLY CRITICAL RESULT CALLED TO, READ BACK BY AND VERIFIED WITH: NATHAN BELEUE @0258  ON 09/03/23 SKL ANAEROBIC BOTTLE ONLY GRAM POSITIVE COCCI GRAM POSITIVE RODS CRITICAL RESULT CALLED TO, READ BACK BY AND VERIFIED WITH: TREY GREENWOOD AT 1002 09/04/23.PMF Performed at [facility], [address] [place], [place] [address]    Culture (A)  Final    ENTEROCOCCUS FAECALIS SUSCEPTIBILITIES TO FOLLOW Performed at [facility], [address] [place], [place] [address]    Report Status PENDING  Incomplete  Blood Culture ID Panel (Reflexed)     Status: Abnormal   Collection Time: 09/02/23 11:45 AM  Result Value Ref Range Status   Enterococcus faecalis DETECTED (A) NOT DETECTED Final    Comment: CRITICAL RESULT CALLED TO, READ BACK BY AND VERIFIED WITH: NATHAN BELEUE @0258  ON 09/03/23 SKL    Enterococcus Faecium NOT DETECTED NOT DETECTED Final   Listeria monocytogenes NOT DETECTED NOT DETECTED Final   Staphylococcus species NOT DETECTED NOT DETECTED Final   Staphylococcus aureus (BCID) NOT DETECTED NOT DETECTED Final   Staphylococcus epidermidis NOT DETECTED NOT DETECTED Final   Staphylococcus lugdunensis NOT DETECTED NOT  DETECTED Final   Streptococcus species NOT DETECTED NOT DETECTED Final   Streptococcus agalactiae NOT DETECTED NOT DETECTED Final   Streptococcus pneumoniae NOT DETECTED NOT DETECTED Final   Streptococcus pyogenes NOT DETECTED NOT DETECTED Final   A.calcoaceticus-baumannii NOT DETECTED NOT DETECTED Final   Bacteroides fragilis NOT DETECTED NOT DETECTED Final   Enterobacterales NOT DETECTED NOT DETECTED Final   Enterobacter cloacae complex NOT DETECTED NOT DETECTED Final   Escherichia coli NOT DETECTED NOT DETECTED Final   Klebsiella aerogenes NOT DETECTED NOT DETECTED Final   Klebsiella oxytoca NOT DETECTED NOT DETECTED Final   Klebsiella pneumoniae NOT DETECTED NOT DETECTED Final   Proteus species NOT DETECTED NOT DETECTED Final   Salmonella species NOT DETECTED NOT DETECTED Final   Serratia marcescens NOT DETECTED NOT DETECTED Final   Haemophilus influenzae NOT DETECTED NOT DETECTED Final   Neisseria meningitidis NOT DETECTED NOT DETECTED Final   Pseudomonas aeruginosa NOT DETECTED NOT DETECTED Final   Stenotrophomonas maltophilia  NOT DETECTED NOT DETECTED Final   Candida albicans NOT DETECTED NOT DETECTED Final   Candida auris NOT DETECTED NOT DETECTED Final   Candida glabrata NOT DETECTED NOT DETECTED Final   Candida krusei NOT DETECTED NOT DETECTED Final   Candida parapsilosis NOT DETECTED NOT DETECTED Final   Candida tropicalis NOT DETECTED NOT DETECTED Final   Cryptococcus neoformans/gattii NOT DETECTED NOT DETECTED Final   Vancomycin  resistance NOT DETECTED NOT DETECTED Final    Comment: Performed at [facility], [address] [place], [place] [address]  Resp panel by RT-PCR (RSV, Flu A&B, Covid) Anterior Nasal Swab     Status: None   Collection Time: 09/02/23 12:17 PM   Specimen: Anterior Nasal Swab  Result Value Ref Range Status   SARS Coronavirus 2 by RT PCR NEGATIVE NEGATIVE Final    Comment: (NOTE) SARS-CoV-2 target nucleic acids are NOT DETECTED.  The SARS-CoV-2 RNA is generally detectable in upper respiratory specimens during the acute phase of infection. The lowest concentration of SARS-CoV-2 viral copies this assay can detect is 138 copies/mL. A negative result does not preclude SARS-Cov-2 infection and should not be used as the sole basis for treatment or other patient management decisions. A negative result may occur with  improper specimen collection/handling, submission of specimen other than nasopharyngeal swab, presence of viral mutation(s) within the areas targeted by this assay, and inadequate number of viral copies(<138 copies/mL). A negative result must be combined with clinical observations, patient history, and epidemiological information. The expected result is Negative.  Fact Sheet for Patients:  BloggerCourse.com  Fact Sheet for Healthcare Providers:  SeriousBroker.it  This test is no t yet approved or cleared by the [place]  FDA and  has been authorized for detection and/or diagnosis of SARS-CoV-2 by FDA  under an Emergency Use Authorization (EUA). This EUA will remain  in effect (meaning this test can be used) for the duration of the COVID-19 declaration under Section 564(b)(1) of the Act, 21 U.S.C.section 360bbb-3(b)(1), unless the authorization is terminated  or revoked sooner.       Influenza A by PCR NEGATIVE NEGATIVE Final   Influenza B by PCR NEGATIVE NEGATIVE Final    Comment: (NOTE) The Xpert Xpress SARS-CoV-2/FLU/RSV plus assay is intended as an aid in the diagnosis of influenza from Nasopharyngeal swab specimens and should not be used as a sole basis for treatment. Nasal washings and aspirates are unacceptable for Xpert Xpress SARS-CoV-2/FLU/RSV testing.  Fact Sheet for Patients: BloggerCourse.com  Fact Sheet for Healthcare Providers: SeriousBroker.it  This test is not yet approved or cleared by the [place]  FDA and has been authorized for detection and/or diagnosis of SARS-CoV-2 by FDA under an Emergency Use Authorization (EUA). This EUA will remain in effect (meaning this test can be used) for the duration of the COVID-19 declaration under Section 564(b)(1) of the Act, 21 U.S.C. section 360bbb-3(b)(1), unless the authorization is terminated or revoked.     Resp Syncytial Virus by PCR NEGATIVE NEGATIVE Final    Comment: (NOTE) Fact Sheet for Patients: BloggerCourse.com  Fact Sheet for Healthcare Providers: SeriousBroker.it  This test is not yet approved or cleared by the [place]  FDA and has been authorized for detection and/or diagnosis of SARS-CoV-2 by FDA under an Emergency Use Authorization (EUA). This EUA will remain in effect (meaning this test can be used) for the duration of the COVID-19 declaration under Section 564(b)(1) of the Act, 21 U.S.C. section 360bbb-3(b)(1), unless the authorization is terminated or revoked.  Performed at [facility], [address] [place], [place] [address]   Blood Culture (routine x 2)     Status: None (Preliminary result)   Collection Time: 09/02/23 12:17 PM   Specimen: BLOOD  Result Value Ref Range Status   Specimen Description BLOOD BLOOD RIGHT FOREARM  Final   Special Requests   Final    BOTTLES DRAWN AEROBIC AND ANAEROBIC Blood Culture adequate volume   Culture   Final    NO GROWTH 3 DAYS Performed at [facility], [address] [place], [place] [address]    Report Status PENDING  Incomplete  MRSA Next Gen by PCR, Nasal     Status: None   Collection Time: 09/02/23  6:58 PM   Specimen: Nasal Mucosa; Nasal Swab  Result Value Ref Range Status   MRSA by PCR Next Gen NOT DETECTED NOT DETECTED Final    Comment: (NOTE) The GeneXpert MRSA Assay (FDA approved for NASAL specimens only), is one component of a comprehensive MRSA colonization surveillance program. It is not intended to diagnose MRSA infection nor to guide or monitor treatment for MRSA infections. Test performance is not FDA approved in patients less than 53 years old. Performed at [facility], [address] [place], [place] [address]   Culture, blood (Routine X 2) w Reflex to ID Panel     Status: None (Preliminary result)   Collection Time: 09/03/23 11:33 PM   Specimen: BLOOD  Result Value Ref Range Status   Specimen Description BLOOD RIGHT ARM  Final   Special Requests   Final    BOTTLES DRAWN AEROBIC AND ANAEROBIC Blood Culture adequate volume   Culture   Final    NO GROWTH 2 DAYS Performed at [facility], [address], [place], [place] [address]    Report Status PENDING  Incomplete  Culture, blood (Routine X 2) w Reflex to ID Panel     Status: None (Preliminary result)   Collection Time: 09/03/23 11:41 PM   Specimen: BLOOD  Result Value Ref Range Status   Specimen Description BLOOD LEFT ARM  Final   Special Requests   Final    BOTTLES DRAWN AEROBIC ONLY Blood  Culture results may not be optimal due to an inadequate volume of blood received in culture bottles   Culture   Final    NO GROWTH 2 DAYS Performed at [facility], [address] [place], [place] [address]    Report Status PENDING  Incomplete  Radiology Studies: No results found.       Scheduled Meds:  vitamin C  500 mg Oral BID   Chlorhexidine  Gluconate Cloth  6 each Topical Daily   fluconazole   200 mg Oral Daily   heparin   5,000 Units Subcutaneous Q8H   insulin  aspart  0-15 Units Subcutaneous TID WC   insulin  aspart  0-5 Units Subcutaneous QHS   insulin  aspart  4 Units Subcutaneous TID WC   insulin  glargine-yfgn  25 Units Subcutaneous Daily   multivitamin with minerals  1 tablet Oral Daily   nicotine   14 mg Transdermal Daily   nystatin    Topical TID   polyethylene glycol  17 g Oral Daily   Ensure Max Protein  11 oz Oral BID   thiamine   100 mg Oral Daily   Continuous Infusions:  ampicillin -sulbactam (UNASYN ) IV Stopped (09/05/23 0733)     LOS: 3 days       Alphonsus Jeans, MD [facility] Pager [phone]  If 7PM-7AM, please contact night-coverage [email or web address] 09/05/2023, 7:57 AM

## 2023-09-05 NOTE — Progress Notes (Signed)
 Per Dr Broadus Canes, dc expired tele monitoring order

## 2023-09-06 ENCOUNTER — Other Ambulatory Visit (HOSPITAL_COMMUNITY): Payer: Self-pay

## 2023-09-06 ENCOUNTER — Inpatient Hospital Stay
Admit: 2023-09-06 | Discharge: 2023-09-06 | Disposition: A | Discharge status: Home / Self Care | Attending: Infectious Diseases | Admitting: Infectious Diseases

## 2023-09-06 ENCOUNTER — Telehealth (HOSPITAL_COMMUNITY): Payer: Self-pay | Admitting: Pharmacy Technician

## 2023-09-06 DIAGNOSIS — B952 Enterococcus as the cause of diseases classified elsewhere: Secondary | ICD-10-CM | POA: Diagnosis not present

## 2023-09-06 DIAGNOSIS — E1169 Type 2 diabetes mellitus with other specified complication: Secondary | ICD-10-CM

## 2023-09-06 DIAGNOSIS — E871 Hypo-osmolality and hyponatremia: Secondary | ICD-10-CM | POA: Diagnosis not present

## 2023-09-06 DIAGNOSIS — R7881 Bacteremia: Secondary | ICD-10-CM | POA: Diagnosis not present

## 2023-09-06 DIAGNOSIS — B372 Candidiasis of skin and nail: Secondary | ICD-10-CM | POA: Diagnosis not present

## 2023-09-06 LAB — BASIC METABOLIC PANEL WITH GFR
Anion gap: 9 (ref 5–15)
BUN: 15 mg/dL (ref 8–23)
CO2: 29 mmol/L (ref 22–32)
Calcium: 8.7 mg/dL — ABNORMAL LOW (ref 8.9–10.3)
Chloride: 95 mmol/L — ABNORMAL LOW (ref 98–111)
Creatinine, Ser: 0.57 mg/dL (ref 0.44–1.00)
GFR, Estimated: >60 mL/min (ref >60)
Glucose, Bld: 195 mg/dL — ABNORMAL HIGH (ref 70–99)
Potassium: 3.8 mmol/L (ref 3.5–5.1)
Sodium: 133 mmol/L — ABNORMAL LOW (ref 135–145)

## 2023-09-06 LAB — GLUCOSE, CAPILLARY
Glucose-Capillary: 233 mg/dL — ABNORMAL HIGH (ref 70–99)
Glucose-Capillary: 272 mg/dL — ABNORMAL HIGH (ref 70–99)
Glucose-Capillary: 255 mg/dL — ABNORMAL HIGH (ref 70–99)
Glucose-Capillary: 164 mg/dL — ABNORMAL HIGH (ref 70–99)

## 2023-09-06 LAB — PHOSPHORUS: Phosphorus: 2.4 mg/dL — ABNORMAL LOW (ref 2.5–4.6)

## 2023-09-06 LAB — ECHOCARDIOGRAM COMPLETE
AR max vel: 2.78 cm2
AV Area VTI: 2.49 cm2
AV Area mean vel: 2.52 cm2
AV Mean grad: 7 mmHg
AV Peak grad: 9.7 mmHg
Ao pk vel: 1.56 m/s
Area-P 1/2: 3.33 cm2
Height: 62 in
S' Lateral: 2.3 cm
Weight: 3555.58 [oz_av]

## 2023-09-06 LAB — CBC
HCT: 41.1 % (ref 36.0–46.0)
Hemoglobin: 13.7 g/dL (ref 12.0–15.0)
MCH: 30.1 pg (ref 26.0–34.0)
MCHC: 33.3 g/dL (ref 30.0–36.0)
MCV: 90.3 fL (ref 80.0–100.0)
Platelets: 125 10*3/uL — ABNORMAL LOW (ref 150–400)
RBC: 4.55 MIL/uL (ref 3.87–5.11)
RDW: 13.3 % (ref 11.5–15.5)
WBC: 6.2 10*3/uL (ref 4.0–10.5)
nRBC: 0 % (ref 0.0–0.2)

## 2023-09-06 LAB — MAGNESIUM: Magnesium: 1.6 mg/dL — ABNORMAL LOW (ref 1.7–2.4)

## 2023-09-06 MED ORDER — MAGNESIUM SULFATE 2 GM/50ML IV SOLN
2.0000 g | Freq: Once | INTRAVENOUS | Status: AC
  Administered 2023-09-06: 2 g via INTRAVENOUS
  Filled 2023-09-06: qty 50

## 2023-09-06 MED ORDER — INSULIN ASPART 100 UNIT/ML IJ SOLN
13.0000 [IU] | Freq: Three times a day (TID) | INTRAMUSCULAR | Status: DC
  Administered 2023-09-06 – 2023-09-10 (×14): 13 [IU] via SUBCUTANEOUS
  Filled 2023-09-06 (×14): qty 1

## 2023-09-06 MED ORDER — SIMETHICONE 80 MG PO CHEW
80.0000 mg | CHEWABLE_TABLET | Freq: Four times a day (QID) | ORAL | Status: DC | PRN
  Administered 2023-09-06: 80 mg via ORAL
  Filled 2023-09-06: qty 1

## 2023-09-06 MED ORDER — SODIUM PHOSPHATES 45 MMOLE/15ML IV SOLN
30.0000 mmol | Freq: Once | INTRAVENOUS | Status: AC
  Administered 2023-09-06: 30 mmol via INTRAVENOUS
  Filled 2023-09-06: qty 10

## 2023-09-06 NOTE — Telephone Encounter (Signed)
 Patient Product/process development scientist completed.    The patient is insured through [employer]. Patient has Medicare and is not eligible for a copay card, but may be able to apply for patient assistance or Medicare RX Payment Plan (Patient Must reach out to their plan, if eligible for payment plan), if available.    Ran test claim for Lantus  Pen and the current 30 day co-pay is $0.00.  Ran test claim for Novolog  FlexPen and the current 30 day co-pay is $0.00.  This test claim was processed through [facility]- copay amounts may vary at other pharmacies due to pharmacy/plan contracts, or as the patient moves through the different stages of their insurance plan.     Morgan Arab, CPHT Pharmacy Technician III Certified Patient Advocate [facility] Patient Advocate Team Direct Number: [phone]  Fax: [phone]

## 2023-09-06 NOTE — Progress Notes (Signed)
*  PRELIMINARY RESULTS* Echocardiogram 2D Echocardiogram has been performed.  Shelby Conway 09/06/2023, 2:41 PM

## 2023-09-06 NOTE — Progress Notes (Signed)
 Date of Admission:  09/02/2023      ID: Shelby Conway is a 68 y.o. female Principal Problem:   Hyperosmolar hyperglycemic state (HHS) (HCC) Active Problems:   Sepsis due to cellulitis (HCC)   Leukocytosis   Altered mental status   Hyperosmolar hyponatremia   Chronic pain   At risk for polypharmacy   Vaginal candidiasis   Morbid obesity (HCC)   Essential hypertension    Subjective: Pt s feeling better Sitting in chair  Medications:   vitamin C  500 mg Oral BID   fluconazole   200 mg Oral Daily   heparin   5,000 Units Subcutaneous Q8H   insulin  aspart  0-15 Units Subcutaneous TID WC   insulin  aspart  0-5 Units Subcutaneous QHS   insulin  aspart  13 Units Subcutaneous TID WC   insulin  glargine-yfgn  30 Units Subcutaneous Daily   multivitamin with minerals  1 tablet Oral Daily   nicotine   14 mg Transdermal Daily   nystatin    Topical TID   polyethylene glycol  17 g Oral Daily   Ensure Max Protein  11 oz Oral BID   thiamine   100 mg Oral Daily    Objective: Vital signs in last 24 hours: Patient Vitals for the past 24 hrs:  BP Temp Temp src Pulse Resp SpO2  09/06/23 0750 111/80 98.4 F (36.9 C) Oral 98 18 95 %  09/06/23 0606 (!) 106/57 98.3 F (36.8 C) Oral 64 -- 96 %  09/05/23 1948 116/65 98.1 F (36.7 C) Oral (!) 55 -- 97 %  09/05/23 1552 125/78 98.7 F (37.1 C) -- -- 20 94 %    PHYSICAL EXAM:  General: Alert, cooperative, no distress, appears stated age.  Lungs: Clear to auscultation bilaterally. No Wheezing or Rhonchi. No rales. Heart: Regular rate and rhythm, no murmur, rub or gallop. Abdomen: Soft, non-tender,not distended. Bowel sounds normal. No masses Extremities: atraumatic, no cyanosis. No edema. No clubbing Skin: intertrigo pannus and under breast improving Lymph: Cervical, supraclavicular normal. Neurologic: Grossly non-focal  Lab Results    Latest Ref Rng & Units 09/06/2023    4:24 AM 09/05/2023    3:09 AM 09/04/2023    3:19 AM  CBC  WBC 4.0 -  10.5 K/uL 6.2  7.0  8.1   Hemoglobin 12.0 - 15.0 g/dL 16.1  09.6  04.5   Hematocrit 36.0 - 46.0 % 41.1  36.7  38.9   Platelets 150 - 400 K/uL 125  117  111        Latest Ref Rng & Units 09/06/2023    4:24 AM 09/05/2023    3:09 AM 09/04/2023    3:19 AM  CMP  Glucose 70 - 99 mg/dL 409  811  914   BUN 8 - 23 mg/dL 15  15  11    Creatinine 0.44 - 1.00 mg/dL 7.82  9.56  2.13   Sodium 135 - 145 mmol/L 133  129  129   Potassium 3.5 - 5.1 mmol/L 3.8  4.1  3.8   Chloride 98 - 111 mmol/L 95  94  96   CO2 22 - 32 mmol/L 29  26  25    Calcium 8.9 - 10.3 mg/dL 8.7  8.2  8.2       Microbiology: [facility] 5/1 enterococcus 09/03/23 BC NG Studies/Results: No results found.   Assessment/Plan: Hyperosmolar hyperglycemic presentation  new onset- resolved Management as per primary team Hyponatremia  Dehydration Encephalopathy from the above  resolved ? Enterococcus bacteremia 1/2 culture- no  obvious source- could be transient bacteremia from bowel pertubation Also there is a gram positive rod  in the same blood culture bottle which likely is a contaminant no prosthetic valve,  Repeat Blood culture NG so far Risk for endocarditis is  low continue unasyn  ( day 4)  Can do PO amoxicillin on discharge until 09/16/23    AKI- resolved   Severe intertriginous candida with possible secondary cellulitis On  fluconazole  200mg   every day x-7 days   Discussed the management with the patient

## 2023-09-06 NOTE — Progress Notes (Signed)
 PROGRESS NOTE    Shelby Conway  ZOX:[id number] DOB: 04-09-1956 DOA: 09/02/2023 PCP: [facility]   Assessment & Plan:   Principal Problem:   Hyperosmolar hyperglycemic state (HHS) (HCC) Active Problems:   Sepsis due to cellulitis (HCC)   Altered mental status   Chronic pain   Essential hypertension   Hyperosmolar hyponatremia   Leukocytosis   Vaginal candidiasis   Morbid obesity (HCC)   At risk for polypharmacy  Assessment and Plan: Hyperosmolar hyperglycemic state: weaned off of insulin  drip. Continue on glargine, aspart, SSI w/ accuchecks. Secondary to poorly controlled DM2. Resolved.  DM2: new dx. Poorly controlled, HbA1c 15.5. Continue on glargine, aspart, SSI w/ accuchecks. Continue w/ DM education. Will d/c home w/ insulin    Bacteremia: blood cxs growing enterococcus faecalis. Echo ordered. Continue on IV unasyn  as per ID. Possibly secondary to transient bacteremia from bowel pertubation as per ID. Repeat blood cxs NGTD.  Sepsis: met criteria w/ leukocytosis, tachycardia, tachypnea & likely abd cellulitis. Continue on IV unasyn . Resolved   Acute encephalopathy: likely secondary to all above. Likely close to baseline   Chronic pain: continue on home dose of oxy    HTN: BP is WNL. Holding home dose of amlodipine   Hyperosmolar hyponatremia: trending up today. Will continue to monitor   Leukocytosis: resolved    Vaginal candidiasis: w/ intertrigo. Continue on fluconazole     Morbid obesity: BMI 40.6. Complicates overall care & prognosis  Thrombocytopenia: etiology unclear. Labile   Hypophosphatemia: sodium phosphate ordered  Hypomagnesemia: mg sulfate ordered   DVT prophylaxis: heparin   Code Status: full  Family Communication:  Disposition Plan: likely d/c back home  Level of care: Med-Surg  Status is: Inpatient Remains inpatient appropriate because: severity of illness    Consultants:  ID  Procedures:   Antimicrobials: unasyn      Subjective: Pt c/o fatigue   Objective: Vitals:   09/05/23 1552 09/05/23 1948 09/06/23 0606 09/06/23 0750  BP: 125/78 116/65 (!) 106/57 111/80  Pulse:  (!) 55 64 98  Resp: 20   18  Temp: 98.7 F (37.1 C) 98.1 F (36.7 C) 98.3 F (36.8 C) 98.4 F (36.9 C)  TempSrc:  Oral Oral Oral  SpO2: 94% 97% 96% 95%  Weight:      Height:        Intake/Output Summary (Last 24 hours) at 09/06/2023 0828 Last data filed at 09/05/2023 1605 Gross per 24 hour  Intake 106.34 ml  Output 1 ml  Net 105.34 ml   Filed Weights   09/02/23 1845 09/04/23 0345 09/05/23 0700  Weight: 98.4 kg 100.7 kg 100.8 kg    Examination:  General exam: Appears calm & comfortable  Respiratory system: diminished breath sounds b/l  Cardiovascular system: S1 & S2+. No rubs or clicks  Gastrointestinal system: abd is soft, NT, obese & normal bowel sounds  Central nervous system: alert & oriented. Moves all extremities  Psychiatry: Judgement and insight appears at baseline. Flat mood and affect    Data Reviewed: I have personally reviewed following labs and imaging studies  CBC: Recent Labs  Lab 09/02/23 1135 09/03/23 0220 09/04/23 0319 09/05/23 0309 09/06/23 0424  WBC 13.7* 13.0* 8.1 7.0 6.2  NEUTROABS 9.9*  --   --   --   --   HGB 15.8* 13.5 13.2 12.4 13.7  HCT 45.5 39.1 38.9 36.7 41.1  MCV 87.8 87.5 89.0 89.1 90.3  PLT 229 134* 111* 117* 125*   Basic Metabolic Panel: Recent Labs  Lab 09/02/23  2152 09/03/23 0220 09/03/23 0824 09/04/23 0319 09/05/23 0309 09/06/23 0424  NA 127* 127* 129* 129* 129* 133*  K 3.5 3.3* 3.7 3.8 4.1 3.8  CL 92* 93* 96* 96* 94* 95*  CO2 28 28 25 25 26 29   GLUCOSE 177* 164* 206* 244* 277* 195*  BUN 18 16 15 11 15 15   CREATININE 0.77 0.70 0.62 0.56 0.59 0.57  CALCIUM 9.1 8.8* 8.4* 8.2* 8.2* 8.7*  MG 1.5* 2.6*  --  1.8 1.7 1.6*  PHOS 1.3* 2.0*  --  2.1* 2.7 2.4*   GFR: Estimated Creatinine Clearance: 74.8 mL/min (by C-G formula based on SCr of 0.57 mg/dL). Liver  Function Tests: Recent Labs  Lab 09/02/23 1135  AST 38  ALT 29  ALKPHOS 98  BILITOT 1.8*  PROT 7.7  ALBUMIN 3.7   No results for input(s): "LIPASE", "AMYLASE" in the last 168 hours. No results for input(s): "AMMONIA" in the last 168 hours. Coagulation Profile: Recent Labs  Lab 09/02/23 1353  INR 1.3*   Cardiac Enzymes: No results for input(s): "CKTOTAL", "CKMB", "CKMBINDEX", "TROPONINI" in the last 168 hours. BNP (last 3 results) No results for input(s): "PROBNP" in the last 8760 hours. HbA1C: No results for input(s): "HGBA1C" in the last 72 hours.  CBG: Recent Labs  Lab 09/05/23 0922 09/05/23 1142 09/05/23 1637 09/05/23 1950 09/06/23 0751  GLUCAP 255* 370* 338* 239* 233*   Lipid Profile: No results for input(s): "CHOL", "HDL", "LDLCALC", "TRIG", "CHOLHDL", "LDLDIRECT" in the last 72 hours. Thyroid Function Tests: No results for input(s): "TSH", "T4TOTAL", "FREET4", "T3FREE", "THYROIDAB" in the last 72 hours. Anemia Panel: No results for input(s): "VITAMINB12", "FOLATE", "FERRITIN", "TIBC", "IRON", "RETICCTPCT" in the last 72 hours. Sepsis Labs: Recent Labs  Lab 09/02/23 1149 09/02/23 1353  LATICACIDVEN 5.6* 3.4*    Recent Results (from the past 240 hours)  Blood Culture (routine x 2)     Status: Abnormal (Preliminary result)   Collection Time: 09/02/23 11:45 AM   Specimen: BLOOD  Result Value Ref Range Status   Specimen Description   Final    BLOOD BLOOD LEFT HAND Performed at [facility], [address] [place], [place] [address]    Special Requests   Final    BOTTLES DRAWN AEROBIC AND ANAEROBIC Blood Culture results may not be optimal due to an inadequate volume of blood received in culture bottles Performed at [facility], [address] [place], [place] [address]    Culture  Setup Time   Final    GRAM POSITIVE COCCI AEROBIC BOTTLE ONLY CRITICAL RESULT CALLED TO, READ BACK BY AND VERIFIED WITH: NATHAN BELEUE @0258  ON 09/03/23  SKL ANAEROBIC BOTTLE ONLY GRAM POSITIVE COCCI GRAM POSITIVE RODS CRITICAL RESULT CALLED TO, READ BACK BY AND VERIFIED WITH: TREY GREENWOOD AT 1002 09/04/23.PMF Performed at [facility], [address], [place], [place] [address]    Culture ENTEROCOCCUS FAECALIS (A)  Final   Report Status PENDING  Incomplete   Organism ID, Bacteria ENTEROCOCCUS FAECALIS  Final      Susceptibility   Enterococcus faecalis - MIC*    AMPICILLIN  <=2 SENSITIVE Sensitive     VANCOMYCIN  2 SENSITIVE Sensitive     GENTAMICIN SYNERGY SENSITIVE Sensitive     * ENTEROCOCCUS FAECALIS  Blood Culture ID Panel (Reflexed)     Status: Abnormal   Collection Time: 09/02/23 11:45 AM  Result Value Ref Range Status   Enterococcus faecalis DETECTED (A) NOT DETECTED Final    Comment: CRITICAL RESULT CALLED TO,  READ BACK BY AND VERIFIED WITH: NATHAN BELEUE @0258  ON 09/03/23 SKL    Enterococcus Faecium NOT DETECTED NOT DETECTED Final   Listeria monocytogenes NOT DETECTED NOT DETECTED Final   Staphylococcus species NOT DETECTED NOT DETECTED Final   Staphylococcus aureus (BCID) NOT DETECTED NOT DETECTED Final   Staphylococcus epidermidis NOT DETECTED NOT DETECTED Final   Staphylococcus lugdunensis NOT DETECTED NOT DETECTED Final   Streptococcus species NOT DETECTED NOT DETECTED Final   Streptococcus agalactiae NOT DETECTED NOT DETECTED Final   Streptococcus pneumoniae NOT DETECTED NOT DETECTED Final   Streptococcus pyogenes NOT DETECTED NOT DETECTED Final   A.calcoaceticus-baumannii NOT DETECTED NOT DETECTED Final   Bacteroides fragilis NOT DETECTED NOT DETECTED Final   Enterobacterales NOT DETECTED NOT DETECTED Final   Enterobacter cloacae complex NOT DETECTED NOT DETECTED Final   Escherichia coli NOT DETECTED NOT DETECTED Final   Klebsiella aerogenes NOT DETECTED NOT DETECTED Final   Klebsiella oxytoca NOT DETECTED NOT DETECTED Final   Klebsiella pneumoniae NOT DETECTED NOT DETECTED Final   Proteus species NOT  DETECTED NOT DETECTED Final   Salmonella species NOT DETECTED NOT DETECTED Final   Serratia marcescens NOT DETECTED NOT DETECTED Final   Haemophilus influenzae NOT DETECTED NOT DETECTED Final   Neisseria meningitidis NOT DETECTED NOT DETECTED Final   Pseudomonas aeruginosa NOT DETECTED NOT DETECTED Final   Stenotrophomonas maltophilia NOT DETECTED NOT DETECTED Final   Candida albicans NOT DETECTED NOT DETECTED Final   Candida auris NOT DETECTED NOT DETECTED Final   Candida glabrata NOT DETECTED NOT DETECTED Final   Candida krusei NOT DETECTED NOT DETECTED Final   Candida parapsilosis NOT DETECTED NOT DETECTED Final   Candida tropicalis NOT DETECTED NOT DETECTED Final   Cryptococcus neoformans/gattii NOT DETECTED NOT DETECTED Final   Vancomycin  resistance NOT DETECTED NOT DETECTED Final    Comment: Performed at [facility], [address] [place], [place] [address]  Resp panel by RT-PCR (RSV, Flu A&B, Covid) Anterior Nasal Swab     Status: None   Collection Time: 09/02/23 12:17 PM   Specimen: Anterior Nasal Swab  Result Value Ref Range Status   SARS Coronavirus 2 by RT PCR NEGATIVE NEGATIVE Final    Comment: (NOTE) SARS-CoV-2 target nucleic acids are NOT DETECTED.  The SARS-CoV-2 RNA is generally detectable in upper respiratory specimens during the acute phase of infection. The lowest concentration of SARS-CoV-2 viral copies this assay can detect is 138 copies/mL. A negative result does not preclude SARS-Cov-2 infection and should not be used as the sole basis for treatment or other patient management decisions. A negative result may occur with  improper specimen collection/handling, submission of specimen other than nasopharyngeal swab, presence of viral mutation(s) within the areas targeted by this assay, and inadequate number of viral copies(<138 copies/mL). A negative result must be combined with clinical observations, patient history, and  epidemiological information. The expected result is Negative.  Fact Sheet for Patients:  BloggerCourse.com  Fact Sheet for Healthcare Providers:  SeriousBroker.it  This test is no t yet approved or cleared by the [place]  FDA and  has been authorized for detection and/or diagnosis of SARS-CoV-2 by FDA under an Emergency Use Authorization (EUA). This EUA will remain  in effect (meaning this test can be used) for the duration of the COVID-19 declaration under Section 564(b)(1) of the Act, 21 U.S.C.section 360bbb-3(b)(1), unless the authorization is terminated  or revoked sooner.       Influenza A by PCR NEGATIVE NEGATIVE Final   Influenza  B by PCR NEGATIVE NEGATIVE Final    Comment: (NOTE) The Xpert Xpress SARS-CoV-2/FLU/RSV plus assay is intended as an aid in the diagnosis of influenza from Nasopharyngeal swab specimens and should not be used as a sole basis for treatment. Nasal washings and aspirates are unacceptable for Xpert Xpress SARS-CoV-2/FLU/RSV testing.  Fact Sheet for Patients: BloggerCourse.com  Fact Sheet for Healthcare Providers: SeriousBroker.it  This test is not yet approved or cleared by the [place]  FDA and has been authorized for detection and/or diagnosis of SARS-CoV-2 by FDA under an Emergency Use Authorization (EUA). This EUA will remain in effect (meaning this test can be used) for the duration of the COVID-19 declaration under Section 564(b)(1) of the Act, 21 U.S.C. section 360bbb-3(b)(1), unless the authorization is terminated or revoked.     Resp Syncytial Virus by PCR NEGATIVE NEGATIVE Final    Comment: (NOTE) Fact Sheet for Patients: BloggerCourse.com  Fact Sheet for Healthcare Providers: SeriousBroker.it  This test is not yet approved or cleared by the [place]  FDA and has been  authorized for detection and/or diagnosis of SARS-CoV-2 by FDA under an Emergency Use Authorization (EUA). This EUA will remain in effect (meaning this test can be used) for the duration of the COVID-19 declaration under Section 564(b)(1) of the Act, 21 U.S.C. section 360bbb-3(b)(1), unless the authorization is terminated or revoked.  Performed at [facility], [address] [place], [place] [address]   Blood Culture (routine x 2)     Status: None (Preliminary result)   Collection Time: 09/02/23 12:17 PM   Specimen: BLOOD  Result Value Ref Range Status   Specimen Description BLOOD BLOOD RIGHT FOREARM  Final   Special Requests   Final    BOTTLES DRAWN AEROBIC AND ANAEROBIC Blood Culture adequate volume   Culture   Final    NO GROWTH 3 DAYS Performed at [facility], [address], [place], [place] [address]    Report Status PENDING  Incomplete  MRSA Next Gen by PCR, Nasal     Status: None   Collection Time: 09/02/23  6:58 PM   Specimen: Nasal Mucosa; Nasal Swab  Result Value Ref Range Status   MRSA by PCR Next Gen NOT DETECTED NOT DETECTED Final    Comment: (NOTE) The GeneXpert MRSA Assay (FDA approved for NASAL specimens only), is one component of a comprehensive MRSA colonization surveillance program. It is not intended to diagnose MRSA infection nor to guide or monitor treatment for MRSA infections. Test performance is not FDA approved in patients less than 73 years old. Performed at [facility], [address] [place], [place] [address]   Culture, blood (Routine X 2) w Reflex to ID Panel     Status: None (Preliminary result)   Collection Time: 09/03/23 11:33 PM   Specimen: BLOOD  Result Value Ref Range Status   Specimen Description BLOOD RIGHT ARM  Final   Special Requests   Final    BOTTLES DRAWN AEROBIC AND ANAEROBIC Blood Culture adequate volume   Culture   Final    NO GROWTH 2 DAYS Performed at [facility] [address], [place], [place] [address]    Report Status PENDING  Incomplete  Culture, blood (Routine X 2) w Reflex to ID Panel     Status: None (Preliminary result)   Collection Time: 09/03/23 11:41 PM   Specimen: BLOOD  Result Value Ref Range Status   Specimen Description BLOOD LEFT ARM  Final   Special Requests  Final    BOTTLES DRAWN AEROBIC ONLY Blood Culture results may not be optimal due to an inadequate volume of blood received in culture bottles   Culture   Final    NO GROWTH 2 DAYS Performed at [facility], [address], [place], [place] [address]    Report Status PENDING  Incomplete         Radiology Studies: No results found.       Scheduled Meds:  vitamin C  500 mg Oral BID   fluconazole   200 mg Oral Daily   heparin   5,000 Units Subcutaneous Q8H   insulin  aspart  0-15 Units Subcutaneous TID WC   insulin  aspart  0-5 Units Subcutaneous QHS   insulin  aspart  13 Units Subcutaneous TID WC   insulin  glargine-yfgn  30 Units Subcutaneous Daily   multivitamin with minerals  1 tablet Oral Daily   nicotine   14 mg Transdermal Daily   nystatin    Topical TID   polyethylene glycol  17 g Oral Daily   Ensure Max Protein  11 oz Oral BID   thiamine   100 mg Oral Daily   Continuous Infusions:  ampicillin -sulbactam (UNASYN ) IV 3 g (09/06/23 0559)   magnesium  sulfate bolus IVPB     sodium PHOSPHATE IVPB (in mmol)       LOS: 4 days       Alphonsus Jeans, MD [facility] Pager [phone]  If 7PM-7AM, please contact night-coverage [email or web address] 09/06/2023, 8:28 AM

## 2023-09-06 NOTE — Inpatient Diabetes Management (Addendum)
 Inpatient Diabetes Program Recommendations  AACE/ADA: New Consensus Statement on Inpatient Glycemic Control   Target Ranges:  Prepandial:   less than 140 mg/dL      Peak postprandial:   less than 180 mg/dL (1-2 hours)      Critically ill patients:  140 - 180 mg/dL    Latest Reference Range & Units 09/05/23 07:29 09/05/23 09:22 09/05/23 11:42 09/05/23 16:37 09/05/23 19:50  Glucose-Capillary 70 - 99 mg/dL 161 (H) 096 (H) 045 (H) 338 (H) 239 (H)    Latest Reference Range & Units 09/02/23 15:28  Hemoglobin A1C 4.8 - 5.6 % 15.5 (H)   Review of Glycemic Control  Diabetes history: No; new DM2 dx this admission Outpatient Diabetes medications: NA Current orders for Inpatient glycemic control: Semglee  30 units daily, Novolog  8 units TID with meals, Novolog  0-15 units TID with meals, Novolog  0-5 units QHS  Inpatient Diabetes Program Recommendations:    Insulin : Noted Semglee  increased from 25 to 30 units daily. Please consider increasing meal coverage to Novolog  13 units TID with meals if patient eats at least 50% of meals.  Addendun 09/06/23@11 :15-Spoke with patient at bedside about new diabetes diagnosis and insulin .   Reviewed A1C results (15.5% ib 09/02/23) and explained what an A1C is and informed patient that his current A1C indicates an average glucose of 398 mg/dl over the past 2-3 months. Discussed basic pathophysiology of DM Type 2, basic home care, importance of checking CBGs and maintaining good CBG control to prevent long-term and short-term complications. Reviewed glucose and A1C goals. Reviewed signs and symptoms of hyperglycemia and hypoglycemia along with treatment for both. Discussed impact of nutrition, exercise, stress, sickness, and medications on diabetes control. Patient has  Living Well with diabetes booklet and has been reading in the book; encouraged patient to read through entire book. Informed patient that she will be prescribed Lantus  and Novolog  (copay $0). DiscusseLantus   and Novolog  an how each insulin  works.  Educated patient  on insulin  pen use at home. Reviewed all steps of insulin  pen including attachment of needle, 2-unit air shot, dialing up dose, giving injection, removing needle, disposal of sharps, storage of unused insulin , disposal of insulin  etc. Patient states she has given herself blood thinner shots in the past and her son Shelby Conway also helped her with SQ injections of blood thinners. Patient reports that her son lives with her and will be able to help with the insulin  if needed. Encouraged patient to stay in touch with PCP regarding glycemic control and to follow up as directed at discharge.  Informed patient that RN will be asking her to self-administer insulin  to ensure proper technique and ability to administer self insulin  shots.   Patient verbalized understanding of information discussed and she states that she has no further questions at this time related to diabetes.   RNs to provide ongoing basic DM education at bedside with this patient and engage patient to actively check blood glucose and administer insulin  injections.   Addendum 09/06/23@14 :05-Talked with patient's son Shelby Conway) over the phone regarding his mother's new DM dx and going home on insulin . Shelby Conway confirms that he has given his mother SQ injections of blood thinner in the past and he has several friends that take insulin . Shelby Conway reports that he has been researching online about diabetes trying to learn as much as he can about it. Shelby Conway also notes that when he came to the hospital today he got his mother's Living Well with DM book so he can read  it as well. Discussed some basics of DM management. Reviewed A1C of 15.5% indicating average glucose of .98 mg/dl. Also discussed glucose and A1C goals.  Verbally reviewed all steps of using an insulin  pen, injection sites, importance of rotating injection sites.  Encouraged Shelby Conway to watch youtube video on 'how to use an insulin  pen' to help increase knowledge about  using the insulin  pen. Shelby Conway appreciative of call and verbalized understanding of information discussed.   Thanks, Beacher Limerick, RN, MSN, CDCES Diabetes Coordinator Inpatient Diabetes Program [phone] (Team Pager from 8am to 5pm)

## 2023-09-06 NOTE — Plan of Care (Signed)
  Problem: Coping: Goal: Ability to adjust to condition or change in health will improve Outcome: Progressing   Problem: Fluid Volume: Goal: Ability to maintain a balanced intake and output will improve Outcome: Progressing   Problem: Health Behavior/Discharge Planning: Goal: Ability to identify and utilize available resources and services will improve Outcome: Progressing Goal: Ability to manage health-related needs will improve Outcome: Progressing   Problem: Metabolic: Goal: Ability to maintain appropriate glucose levels will improve Outcome: Progressing   Problem: Nutritional: Goal: Maintenance of adequate nutrition will improve Outcome: Progressing Goal: Progress toward achieving an optimal weight will improve Outcome: Progressing   Problem: Skin Integrity: Goal: Risk for impaired skin integrity will decrease Outcome: Progressing   Problem: Tissue Perfusion: Goal: Adequacy of tissue perfusion will improve Outcome: Progressing   Problem: Education: Goal: Ability to describe self-care measures that may prevent or decrease complications (Diabetes Survival Skills Education) will improve Outcome: Progressing   Problem: Cardiac: Goal: Ability to maintain an adequate cardiac output will improve Outcome: Progressing   Problem: Health Behavior/Discharge Planning: Goal: Ability to identify and utilize available resources and services will improve Outcome: Progressing   Problem: Metabolic: Goal: Ability to maintain appropriate glucose levels will improve Outcome: Progressing   Problem: Nutritional: Goal: Maintenance of adequate nutrition will improve Outcome: Progressing Goal: Maintenance of adequate weight for body size and type will improve Outcome: Progressing   Problem: Respiratory: Goal: Will regain and/or maintain adequate ventilation Outcome: Progressing   Problem: Urinary Elimination: Goal: Ability to achieve and maintain adequate renal perfusion and  functioning will improve Outcome: Progressing   Problem: Clinical Measurements: Goal: Will remain free from infection Outcome: Progressing Goal: Respiratory complications will improve Outcome: Progressing   Problem: Activity: Goal: Risk for activity intolerance will decrease Outcome: Progressing   Problem: Nutrition: Goal: Adequate nutrition will be maintained Outcome: Progressing   Problem: Coping: Goal: Level of anxiety will decrease Outcome: Progressing   Problem: Elimination: Goal: Will not experience complications related to bowel motility Outcome: Progressing   Problem: Pain Managment: Goal: General experience of comfort will improve and/or be controlled Outcome: Progressing   Problem: Safety: Goal: Ability to remain free from injury will improve Outcome: Progressing   Problem: Skin Integrity: Goal: Risk for impaired skin integrity will decrease Outcome: Progressing

## 2023-09-07 DIAGNOSIS — B952 Enterococcus as the cause of diseases classified elsewhere: Secondary | ICD-10-CM | POA: Diagnosis not present

## 2023-09-07 DIAGNOSIS — B372 Candidiasis of skin and nail: Secondary | ICD-10-CM | POA: Diagnosis not present

## 2023-09-07 DIAGNOSIS — R7881 Bacteremia: Secondary | ICD-10-CM | POA: Diagnosis not present

## 2023-09-07 LAB — BASIC METABOLIC PANEL WITH GFR
Anion gap: 9 (ref 5–15)
BUN: 19 mg/dL (ref 8–23)
CO2: 26 mmol/L (ref 22–32)
Calcium: 9 mg/dL (ref 8.9–10.3)
Chloride: 94 mmol/L — ABNORMAL LOW (ref 98–111)
Creatinine, Ser: 0.63 mg/dL (ref 0.44–1.00)
GFR, Estimated: >60 mL/min (ref >60)
Glucose, Bld: 235 mg/dL — ABNORMAL HIGH (ref 70–99)
Potassium: 4.2 mmol/L (ref 3.5–5.1)
Sodium: 129 mmol/L — ABNORMAL LOW (ref 135–145)

## 2023-09-07 LAB — GLUCOSE, CAPILLARY
Glucose-Capillary: 238 mg/dL — ABNORMAL HIGH (ref 70–99)
Glucose-Capillary: 241 mg/dL — ABNORMAL HIGH (ref 70–99)
Glucose-Capillary: 210 mg/dL — ABNORMAL HIGH (ref 70–99)
Glucose-Capillary: 175 mg/dL — ABNORMAL HIGH (ref 70–99)

## 2023-09-07 LAB — CBC
HCT: 39.4 % (ref 36.0–46.0)
Hemoglobin: 13.1 g/dL (ref 12.0–15.0)
MCH: 29.9 pg (ref 26.0–34.0)
MCHC: 33.2 g/dL (ref 30.0–36.0)
MCV: 90 fL (ref 80.0–100.0)
Platelets: 142 10*3/uL — ABNORMAL LOW (ref 150–400)
RBC: 4.38 MIL/uL (ref 3.87–5.11)
RDW: 13.2 % (ref 11.5–15.5)
WBC: 6.5 10*3/uL (ref 4.0–10.5)
nRBC: 0 % (ref 0.0–0.2)

## 2023-09-07 LAB — CULTURE, BLOOD (ROUTINE X 2)
Culture: NO GROWTH
Special Requests: ADEQUATE

## 2023-09-07 MED ORDER — INSULIN GLARGINE-YFGN 100 UNIT/ML ~~LOC~~ SOLN
35.0000 [IU] | Freq: Every day | SUBCUTANEOUS | Status: DC
  Administered 2023-09-07 – 2023-09-10 (×4): 35 [IU] via SUBCUTANEOUS
  Filled 2023-09-07 (×4): qty 0.35

## 2023-09-07 MED ORDER — SODIUM CHLORIDE 0.9 % IV SOLN
2.0000 g | Freq: Two times a day (BID) | INTRAVENOUS | Status: DC
  Administered 2023-09-07: 2 g via INTRAVENOUS
  Filled 2023-09-07 (×2): qty 20

## 2023-09-07 NOTE — Progress Notes (Signed)
   [facility] has been requested to perform a transesophageal echocardiogram on Quintavia A Shibley for bacteremia. Tentatively scheduled for 5/7 at 0730.    The patient does NOT have any absolute or relative contraindications to a Transesophageal Echocardiogram (TEE).  The patient has: No other conditions that may impact this procedure.  After careful review of history and examination, the risks and benefits of transesophageal echocardiogram have been explained including risks of esophageal damage, perforation (1:10,000 risk), bleeding, pharyngeal hematoma as well as other potential complications associated with conscious sedation including aspiration, arrhythmia, respiratory failure and death. Alternatives to treatment were discussed, questions were answered. Patient is willing to proceed.   Signed, Brodie Cannon, PA-C  09/07/2023 11:09 AM

## 2023-09-07 NOTE — Evaluation (Signed)
 Occupational Therapy Evaluation Patient Details Name: Shelby Conway MRN: [id number] DOB: 12-24-1955 Today's Date: 09/07/2023   History of Present Illness   68 year old female with history of hypertension, history of stroke, morbid obesity, poor dentition, who presents ED via EMS from home for chief concerns of altered mental status. Admitted for hyperosmolar hyperglycemic state, new dx of diabetes mellitus.   Clinical Impressions Pt was seen for OT evaluation this date. Prior to hospital admission, pt was independent with ADL, mobility, and IADL. Pt lives with her son, daughter in law, and 12yo granddaughter. Pt lives in a mobile home with 3-4 steps, R/L rails, and pt mentions that the landlord is talking about putting in a ramped entrance. Pt presents to acute OT demonstrating impaired ADL performance and functional mobility 2/2 decreased strength, activity tolerance, BLE pitting edema, low back pain, and impaired balance (See OT problem list for additional functional deficits). Pt currently requires supv-CGA for ADL transfers and mobility with RW (per pt/neighbor present - formal ADL mobility assessment deferred 2/2 pt beginning to get drowsy after pain medication) and anticipate need for increased assist for LB ADL tasks 2/2 impairments noted above. Pt/visitor educated in home/routines modifications, falls prevention, and AE/DME to support improved safety with bathing, dressing, toileting, and IADL. Pt/visitor verbalized understanding. Pt would benefit from skilled OT services to address noted impairments and functional limitations (see below for any additional details) in order to maximize safety and independence while minimizing falls risk and caregiver burden.    If plan is discharge home, recommend the following:   A little help with walking and/or transfers;A little help with bathing/dressing/bathroom;Assistance with cooking/housework;Assist for transportation;Help with stairs or ramp for  entrance     Functional Status Assessment   Patient has had a recent decline in their functional status and demonstrates the ability to make significant improvements in function in a reasonable and predictable amount of time.    Equipment Recommendations   BSC/3in1;Tub/shower bench;Other (comment) (2WW, reacher, LH sponge, grab bars installed in shower (non-suction type))      Precautions/Restrictions   Precautions Precautions: Fall Restrictions Weight Bearing Restrictions Per Provider Order: No     Mobility Bed Mobility               General bed mobility comments: NT, pt in recliner at start and end of session    Transfers                   General transfer comment: deferred, pt became very sleepy, visitor cites recent pain medication.      Balance Overall balance assessment: Needs assistance Sitting-balance support: No upper extremity supported, Feet supported Sitting balance-Leahy Scale: Good                                     ADL either performed or assessed with clinical judgement   ADL Overall ADL's : Needs assistance/impaired                                       General ADL Comments: Anticipate need for MIN assist for LB ADL tasks and pt/visitor report pt has been ambulating to/from the bathroom with a RW requiring supv. Will further assess next session.     Vision         Perception  Praxis         Pertinent Vitals/Pain Pain Assessment Pain Assessment: 0-10 Pain Score: 8  Pain Location: low back Pain Descriptors / Indicators: Aching, Constant Pain Intervention(s): Limited activity within patient's tolerance, Monitored during session, Premedicated before session, Repositioned     Extremity/Trunk Assessment Upper Extremity Assessment Upper Extremity Assessment: Generalized weakness   Lower Extremity Assessment Lower Extremity Assessment: Generalized weakness       Communication  Communication Communication: No apparent difficulties   Cognition Arousal: Alert, Suspect due to medications Behavior During Therapy: WFL for tasks assessed/performed Cognition: No apparent impairments             OT - Cognition Comments: became more sleepy near the end of session, visitor citing recent pain medication                 Following commands: Intact       Cueing  General Comments   Cueing Techniques: Verbal cues      Exercises Other Exercises Other Exercises: Pt/visitor educated in home/routines modifications, falls prevention, and AE/DME to support improved safety with bathing, dressing, toileting, and IADL. Pt/visitor verbalized understanding.   Shoulder Instructions      Home Living Family/patient expects to be discharged to:: Private residence Living Arrangements: Children (son, DIL, granddtr (12yo)) Available Help at Discharge: Family;Available 24 hours/day Type of Home: Mobile home (rental) Home Access: Stairs to enter [employer] of Steps: 3-4 Entrance Stairs-Rails: Right;Left Home Layout: One level     Bathroom Shower/Tub: Tub/shower unit;Curtain   Bathroom Toilet: Standard     Home Equipment: None          Prior Functioning/Environment Prior Level of Function : Independent/Modified Independent;History of Falls (last six months)             Mobility Comments: no AD, fell the day she was admitted ADLs Comments: Indep with ADL, family assist for meals, cleaning; can drive but hasn't in a while, family can provide transportation    OT Problem List: Decreased strength;Pain;Increased edema;Decreased activity tolerance;Impaired balance (sitting and/or standing);Decreased knowledge of use of DME or AE;Obesity   OT Treatment/Interventions: Self-care/ADL training;Therapeutic exercise;Therapeutic activities;DME and/or AE instruction;Energy conservation;Patient/family education;Balance training      OT Goals(Current goals  can be found in the care plan section)   Acute Rehab OT Goals Patient Stated Goal: get better and go home OT Goal Formulation: With patient Time For Goal Achievement: 09/21/23 Potential to Achieve Goals: Good ADL Goals Pt Will Perform Lower Body Dressing: with modified independence;sit to/from stand (AE PRN) Pt Will Transfer to Toilet: with modified independence;ambulating (LRAD) Pt Will Perform Toileting - Clothing Manipulation and hygiene: with modified independence;sitting/lateral leans;sit to/from stand Additional ADL Goal #1: Pt will complete all aspects of bathing, primarily from sitting with mod indep, AE PRN, 1/1 opportunity. Additional ADL Goal #2: Pt will verbalize plan to implement at least 1 learned falls prevention strategy.   OT Frequency:  Min 2X/week    Co-evaluation              AM-PAC OT "6 Clicks" Daily Activity     Outcome Measure Help from another person eating meals?: None Help from another person taking care of personal grooming?: None Help from another person toileting, which includes using toliet, bedpan, or urinal?: A Little Help from another person bathing (including washing, rinsing, drying)?: A Little Help from another person to put on and taking off regular upper body clothing?: None Help from another person to put on and taking  off regular lower body clothing?: A Little 6 Click Score: 21   End of Session Nurse Communication: Mobility status;Other (comment) (air mattress for bed to improve back pain, SCDs for BLE edema)  Activity Tolerance: Patient limited by fatigue;Patient tolerated treatment well Patient left: in chair;with call bell/phone within reach;with family/visitor present;with nursing/sitter in room  OT Visit Diagnosis: Other abnormalities of gait and mobility (R26.89);Muscle weakness (generalized) (M62.81)                Time: 2841-3244 OT Time Calculation (min): 26 min Charges:  OT General Charges $OT Visit: 1 Visit OT  Evaluation $OT Eval Low Complexity: 1 Low OT Treatments $Self Care/Home Management : 8-22 mins  Berenda Breaker., MPH, MS, OTR/L ascom [phone] 09/07/23, 4:29 PM

## 2023-09-07 NOTE — TOC Progression Note (Signed)
 Transition of Care [facility] - Progression Note    Patient Details  Name: Shelby Conway MRN: [id number] Date of Birth: 1956/02/08  [facility]) CM/SW Contact  Elsie Halo, RN Phone Number: 09/07/2023, 1:05 PM  Clinical Narrative:    Patient lives at home with her son and his wife. She drives and is able to get to and from appointments and to complete errands. 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 The patient is looking for a new PCP.  The patient doesn't have any DME.  TOC added a list of PCPs to the patient's DC Summary.   TOC will continue to follow        Expected Discharge Plan and Services                                               Social Determinants of Health (SDOH) Interventions SDOH Screenings   Food Insecurity: No Food Insecurity (09/02/2023)  Housing: Low Risk  (09/05/2023)  Transportation Needs: No Transportation Needs (09/02/2023)  Utilities: Not At Risk (09/02/2023)  Social Connections: Patient Declined (09/05/2023)  Tobacco Use: Medium Risk (09/02/2023)    Readmission Risk Interventions     No data to display

## 2023-09-07 NOTE — Progress Notes (Signed)
 PROGRESS NOTE   HPI was taken from Dr. Reinhold Carbine: [name] is a 68 year old female with history of hypertension, history of stroke, morbid obesity, poor dentition, who presents ED via EMS from home for chief concerns of altered mental status.   Vitals in the ED showed T of 98, respiration rate of 14, heart rate of 113, blood pressure 96/78, improved to 125/65, SpO2 of 93% on 3 L nasal cannula.   Serum sodium is 122, potassium 4.4, chloride 85, bicarb 21, BUN of 26, serum creatinine 1.30, EGFR 45, nonfasting blood glucose 594, WBC 13.7, hemoglobin of 15.8, platelets of 229.   Anion gap was elevated at 16.  Lactic acid was 5.6.  UA was negative for ketones, leukocytes, nitrates.   Blood cultures x 2 are in process.   ED treatment: Diflucan  150 mg p.o. one-time dose, insulin  10 units, cefepime , Flagyl , vancomycin , LR 1 L bolus, LR sepsis bolus.  LR infusion at 150 mL/h. ------------------------------ At bedside, patient was able to tell me her first and last name, age, location [facility].   She reports she is here because she was confused.  She states she feels better at this time.  She denies chest pain, shortness of breath, nausea, vomiting, diarrhea, dysuria, hematuria, syncope.   She denies trauma to her person.   She reports that she has never been diagnosed with diabetes.    Shelby Conway  MVH:[id number] DOB: 06/16/1955 DOA: 09/02/2023 PCP: [facility]   Assessment & Plan:   Principal Problem:   Hyperosmolar hyperglycemic state (HHS) (HCC) Active Problems:   Sepsis due to cellulitis (HCC)   Altered mental status   Chronic pain   Essential hypertension   Hyperosmolar hyponatremia   Leukocytosis   Vaginal candidiasis   Morbid obesity (HCC)   At risk for polypharmacy   Bacteremia due to Enterococcus   Candidal intertrigo  Assessment and Plan: Hyperosmolar hyperglycemic state: weaned off of insulin  drip. Continue on glargine, aspart, SSI w/  accuchecks. Secondary to poorly controlled DM2. Resolved.  DM2: new dx. Poorly controlled, HbA1c 15.5. Continue on glargine, aspart, SSI w/ accuchecks. Continue w/ DM education. Will d/c home w/ insulin . DM coordinator is following   Bacteremia: blood cxs growing enterococcus faecalis. Echo shows EF 50-55%, diastolic function is indeterminate & echogenic structure on mitral valve- can be degenerative change  related to mitral annular calcification, cannot rule out vegetation. Will have TEE tomorrow as per cardio. NPO after midnight. Continue on IV unasyn  as per ID. Possibly secondary to transient bacteremia from bowel pertubation as per ID. Repeat blood cxs NGTD  Sepsis: met criteria w/ leukocytosis, tachycardia, tachypnea & likely abd cellulitis. Continue on IV unasyn . Resolved   Acute encephalopathy: likely secondary to all above. Likely close to baseline   Chronic pain: continue on home dose of oxy    HTN: BP is WNL. Holding home dose of amlodipine    Hyperosmolar hyponatremia: labile. Will continue to monitor    Leukocytosis: resolved    Vaginal candidiasis: w/ intertrigo. Continue on fluconazole  x7 days as per ID    Morbid obesity: BMI 40.6. Complicates overall care & prognosis   Thrombocytopenia: etiology unclear. Trending up   Hypophosphatemia: replenished   Hypomagnesemia: replenished   DVT prophylaxis: heparin   Code Status: full  Family Communication: dicussed pt's care w/ pt's son, Shelby Conway, and answered his questions Disposition Plan: likely d/c back home  Level of care: Med-Surg  Status is: Inpatient Remains inpatient appropriate because: severity of illness  Consultants:  ID  Procedures:   Antimicrobials: unasyn     Subjective: Pt c/o malaise   Objective: Vitals:   09/06/23 1612 09/06/23 2056 09/07/23 0444 09/07/23 0813  BP: 127/65 (!) 147/70 (!) 123/92 (!) 114/57  Pulse: 74 (!) 101 (!) 106 90  Resp: 18 18 18 18   Temp: 98.7 F (37.1 C) 98.9 F  (37.2 C) 98.7 F (37.1 C) 98.3 F (36.8 C)  TempSrc:  Oral Oral   SpO2: 99% 97% 96% 97%  Weight:   108.8 kg   Height:        Intake/Output Summary (Last 24 hours) at 09/07/2023 0818 Last data filed at 09/06/2023 1516 Gross per 24 hour  Intake 462.67 ml  Output --  Net 462.67 ml   Filed Weights   09/04/23 0345 09/05/23 0700 09/07/23 0444  Weight: 100.7 kg 100.8 kg 108.8 kg    Examination:  General exam: Appears comfortable. Morbidly obese   Respiratory system: decreased breath sounds b/l Cardiovascular system: S1/S2+. No rubs or clicks  Gastrointestinal system: abd is soft, NT, obese & hypoactive bowel sounds   Central nervous system: alert & oriented. Moves all extremities  Psychiatry: Judgement and insight appears at baseline. Flat mood and affect    Data Reviewed: I have personally reviewed following labs and imaging studies  CBC: Recent Labs  Lab 09/02/23 1135 09/03/23 0220 09/04/23 0319 09/05/23 0309 09/06/23 0424 09/07/23 0551  WBC 13.7* 13.0* 8.1 7.0 6.2 6.5  NEUTROABS 9.9*  --   --   --   --   --   HGB 15.8* 13.5 13.2 12.4 13.7 13.1  HCT 45.5 39.1 38.9 36.7 41.1 39.4  MCV 87.8 87.5 89.0 89.1 90.3 90.0  PLT 229 134* 111* 117* 125* 142*   Basic Metabolic Panel: Recent Labs  Lab 09/02/23 2152 09/03/23 0220 09/03/23 0824 09/04/23 0319 09/05/23 0309 09/06/23 0424 09/07/23 0551  NA 127* 127* 129* 129* 129* 133* 129*  K 3.5 3.3* 3.7 3.8 4.1 3.8 4.2  CL 92* 93* 96* 96* 94* 95* 94*  CO2 28 28 25 25 26 29 26   GLUCOSE 177* 164* 206* 244* 277* 195* 235*  BUN 18 16 15 11 15 15 19   CREATININE 0.77 0.70 0.62 0.56 0.59 0.57 0.63  CALCIUM 9.1 8.8* 8.4* 8.2* 8.2* 8.7* 9.0  MG 1.5* 2.6*  --  1.8 1.7 1.6*  --   PHOS 1.3* 2.0*  --  2.1* 2.7 2.4*  --    GFR: Estimated Creatinine Clearance: 78.2 mL/min (by C-G formula based on SCr of 0.63 mg/dL). Liver Function Tests: Recent Labs  Lab 09/02/23 1135  AST 38  ALT 29  ALKPHOS 98  BILITOT 1.8*  PROT 7.7   ALBUMIN 3.7   No results for input(s): "LIPASE", "AMYLASE" in the last 168 hours. No results for input(s): "AMMONIA" in the last 168 hours. Coagulation Profile: Recent Labs  Lab 09/02/23 1353  INR 1.3*   Cardiac Enzymes: No results for input(s): "CKTOTAL", "CKMB", "CKMBINDEX", "TROPONINI" in the last 168 hours. BNP (last 3 results) No results for input(s): "PROBNP" in the last 8760 hours. HbA1C: No results for input(s): "HGBA1C" in the last 72 hours.  CBG: Recent Labs  Lab 09/06/23 0751 09/06/23 1141 09/06/23 1653 09/06/23 2056 09/07/23 0744  GLUCAP 233* 272* 255* 164* 238*   Lipid Profile: No results for input(s): "CHOL", "HDL", "LDLCALC", "TRIG", "CHOLHDL", "LDLDIRECT" in the last 72 hours. Thyroid Function Tests: No results for input(s): "TSH", "T4TOTAL", "FREET4", "T3FREE", "THYROIDAB" in the last 72  hours. Anemia Panel: No results for input(s): "VITAMINB12", "FOLATE", "FERRITIN", "TIBC", "IRON", "RETICCTPCT" in the last 72 hours. Sepsis Labs: Recent Labs  Lab 09/02/23 1149 09/02/23 1353  LATICACIDVEN 5.6* 3.4*    Recent Results (from the past 240 hours)  Blood Culture (routine x 2)     Status: Abnormal (Preliminary result)   Collection Time: 09/02/23 11:45 AM   Specimen: BLOOD  Result Value Ref Range Status   Specimen Description   Final    BLOOD BLOOD LEFT HAND Performed at [facility], [address], [place], [place] [address]    Special Requests   Final    BOTTLES DRAWN AEROBIC AND ANAEROBIC Blood Culture results may not be optimal due to an inadequate volume of blood received in culture bottles Performed at [facility], [address] [place], [place] [address]    Culture  Setup Time   Final    GRAM POSITIVE COCCI AEROBIC BOTTLE ONLY CRITICAL RESULT CALLED TO, READ BACK BY AND VERIFIED WITH: NATHAN BELEUE @0258  ON 09/03/23 [facility] ANAEROBIC BOTTLE ONLY GRAM POSITIVE RODS CRITICAL RESULT CALLED TO, READ BACK BY AND VERIFIED WITH:  TREY GREENWOOD AT 1002 09/04/23.PMF    Culture (A)  Final    ENTEROCOCCUS FAECALIS PEPTOSTREPTOCOCCUS ASACCHAROLYTICUS CULTURE REINCUBATED FOR BETTER GROWTH Performed at [facility], [address] [place], [place] [address]    Report Status PENDING  Incomplete   Organism ID, Bacteria ENTEROCOCCUS FAECALIS  Final      Susceptibility   Enterococcus faecalis - MIC*    AMPICILLIN  <=2 SENSITIVE Sensitive     VANCOMYCIN  2 SENSITIVE Sensitive     GENTAMICIN SYNERGY SENSITIVE Sensitive     * ENTEROCOCCUS FAECALIS  Blood Culture ID Panel (Reflexed)     Status: Abnormal   Collection Time: 09/02/23 11:45 AM  Result Value Ref Range Status   Enterococcus faecalis DETECTED (A) NOT DETECTED Final    Comment: CRITICAL RESULT CALLED TO, READ BACK BY AND VERIFIED WITH: NATHAN BELEUE @0258  ON 09/03/23 [facility]    Enterococcus Faecium NOT DETECTED NOT DETECTED Final   Listeria monocytogenes NOT DETECTED NOT DETECTED Final   Staphylococcus species NOT DETECTED NOT DETECTED Final   Staphylococcus aureus (BCID) NOT DETECTED NOT DETECTED Final   Staphylococcus epidermidis NOT DETECTED NOT DETECTED Final   Staphylococcus lugdunensis NOT DETECTED NOT DETECTED Final   Streptococcus species NOT DETECTED NOT DETECTED Final   Streptococcus agalactiae NOT DETECTED NOT DETECTED Final   Streptococcus pneumoniae NOT DETECTED NOT DETECTED Final   Streptococcus pyogenes NOT DETECTED NOT DETECTED Final   A.calcoaceticus-baumannii NOT DETECTED NOT DETECTED Final   Bacteroides fragilis NOT DETECTED NOT DETECTED Final   Enterobacterales NOT DETECTED NOT DETECTED Final   Enterobacter cloacae complex NOT DETECTED NOT DETECTED Final   Escherichia coli NOT DETECTED NOT DETECTED Final   Klebsiella aerogenes NOT DETECTED NOT DETECTED Final   Klebsiella oxytoca NOT DETECTED NOT DETECTED Final   Klebsiella pneumoniae NOT DETECTED NOT DETECTED Final   Proteus species NOT DETECTED NOT DETECTED Final   Salmonella species NOT  DETECTED NOT DETECTED Final   Serratia marcescens NOT DETECTED NOT DETECTED Final   Haemophilus influenzae NOT DETECTED NOT DETECTED Final   Neisseria meningitidis NOT DETECTED NOT DETECTED Final   Pseudomonas aeruginosa NOT DETECTED NOT DETECTED Final   Stenotrophomonas maltophilia NOT DETECTED NOT DETECTED Final   Candida albicans NOT DETECTED NOT DETECTED Final   Candida auris NOT DETECTED NOT DETECTED Final   Candida glabrata NOT DETECTED NOT DETECTED  Final   Candida krusei NOT DETECTED NOT DETECTED Final   Candida parapsilosis NOT DETECTED NOT DETECTED Final   Candida tropicalis NOT DETECTED NOT DETECTED Final   Cryptococcus neoformans/gattii NOT DETECTED NOT DETECTED Final   Vancomycin  resistance NOT DETECTED NOT DETECTED Final    Comment: Performed at [facility], [address] [place], [place] [address]  Resp panel by RT-PCR (RSV, Flu A&B, Covid) Anterior Nasal Swab     Status: None   Collection Time: 09/02/23 12:17 PM   Specimen: Anterior Nasal Swab  Result Value Ref Range Status   SARS Coronavirus 2 by RT PCR NEGATIVE NEGATIVE Final    Comment: (NOTE) SARS-CoV-2 target nucleic acids are NOT DETECTED.  The SARS-CoV-2 RNA is generally detectable in upper respiratory specimens during the acute phase of infection. The lowest concentration of SARS-CoV-2 viral copies this assay can detect is 138 copies/mL. A negative result does not preclude SARS-Cov-2 infection and should not be used as the sole basis for treatment or other patient management decisions. A negative result may occur with  improper specimen collection/handling, submission of specimen other than nasopharyngeal swab, presence of viral mutation(s) within the areas targeted by this assay, and inadequate number of viral copies(<138 copies/mL). A negative result must be combined with clinical observations, patient history, and epidemiological information. The expected result is Negative.  Fact Sheet for  Patients:  BloggerCourse.com  Fact Sheet for Healthcare Providers:  SeriousBroker.it  This test is no t yet approved or cleared by the [place]  FDA and  has been authorized for detection and/or diagnosis of SARS-CoV-2 by FDA under an Emergency Use Authorization (EUA). This EUA will remain  in effect (meaning this test can be used) for the duration of the COVID-19 declaration under Section 564(b)(1) of the Act, 21 U.S.C.section 360bbb-3(b)(1), unless the authorization is terminated  or revoked sooner.       Influenza A by PCR NEGATIVE NEGATIVE Final   Influenza B by PCR NEGATIVE NEGATIVE Final    Comment: (NOTE) The Xpert Xpress SARS-CoV-2/FLU/RSV plus assay is intended as an aid in the diagnosis of influenza from Nasopharyngeal swab specimens and should not be used as a sole basis for treatment. Nasal washings and aspirates are unacceptable for Xpert Xpress SARS-CoV-2/FLU/RSV testing.  Fact Sheet for Patients: BloggerCourse.com  Fact Sheet for Healthcare Providers: SeriousBroker.it  This test is not yet approved or cleared by the [place]  FDA and has been authorized for detection and/or diagnosis of SARS-CoV-2 by FDA under an Emergency Use Authorization (EUA). This EUA will remain in effect (meaning this test can be used) for the duration of the COVID-19 declaration under Section 564(b)(1) of the Act, 21 U.S.C. section 360bbb-3(b)(1), unless the authorization is terminated or revoked.     Resp Syncytial Virus by PCR NEGATIVE NEGATIVE Final    Comment: (NOTE) Fact Sheet for Patients: BloggerCourse.com  Fact Sheet for Healthcare Providers: SeriousBroker.it  This test is not yet approved or cleared by the [place]  FDA and has been authorized for detection and/or diagnosis of SARS-CoV-2 by FDA under an Emergency  Use Authorization (EUA). This EUA will remain in effect (meaning this test can be used) for the duration of the COVID-19 declaration under Section 564(b)(1) of the Act, 21 U.S.C. section 360bbb-3(b)(1), unless the authorization is terminated or revoked.  Performed at [facility], [address] [place], [place] [address]   Blood Culture (routine x 2)     Status: None (Preliminary result)   Collection Time: 09/02/23  12:17 PM   Specimen: BLOOD  Result Value Ref Range Status   Specimen Description BLOOD BLOOD RIGHT FOREARM  Final   Special Requests   Final    BOTTLES DRAWN AEROBIC AND ANAEROBIC Blood Culture adequate volume   Culture   Final    NO GROWTH 4 DAYS Performed at [facility], [address], [place], [place] [address]    Report Status PENDING  Incomplete  MRSA Next Gen by PCR, Nasal     Status: None   Collection Time: 09/02/23  6:58 PM   Specimen: Nasal Mucosa; Nasal Swab  Result Value Ref Range Status   MRSA by PCR Next Gen NOT DETECTED NOT DETECTED Final    Comment: (NOTE) The GeneXpert MRSA Assay (FDA approved for NASAL specimens only), is one component of a comprehensive MRSA colonization surveillance program. It is not intended to diagnose MRSA infection nor to guide or monitor treatment for MRSA infections. Test performance is not FDA approved in patients less than 62 years old. Performed at [facility], [address] [place], [place] [address]   Culture, blood (Routine X 2) w Reflex to ID Panel     Status: None (Preliminary result)   Collection Time: 09/03/23 11:33 PM   Specimen: BLOOD  Result Value Ref Range Status   Specimen Description BLOOD RIGHT ARM  Final   Special Requests   Final    BOTTLES DRAWN AEROBIC AND ANAEROBIC Blood Culture adequate volume   Culture   Final    NO GROWTH 3 DAYS Performed at [facility], [address] [place], [place] [address]    Report Status PENDING  Incomplete  Culture,  blood (Routine X 2) w Reflex to ID Panel     Status: None (Preliminary result)   Collection Time: 09/03/23 11:41 PM   Specimen: BLOOD  Result Value Ref Range Status   Specimen Description BLOOD LEFT ARM  Final   Special Requests   Final    BOTTLES DRAWN AEROBIC ONLY Blood Culture results may not be optimal due to an inadequate volume of blood received in culture bottles   Culture   Final    NO GROWTH 3 DAYS Performed at [facility], [address] [place], [place] [address]    Report Status PENDING  Incomplete         Radiology Studies: ECHOCARDIOGRAM COMPLETE Result Date: 09/06/2023    ECHOCARDIOGRAM REPORT   Patient Name:   Fareeha A Allston Date of Exam: 09/06/2023 Medical Rec #:  [id number]        Height:       62.0 in Accession #:    [id number]       Weight:       222.2 lb Date of Birth:  [birth year]        BSA:          1.999 m Patient Age:    67 years         BP:           111/80 mmHg Patient Gender: F                HR:           98 bpm. Exam Location:  [facility] Procedure: 2D Echo, Cardiac Doppler and Color Doppler (Both Spectral and Color            Flow Doppler were utilized during procedure). Indications:     Bacteremia R78.81  History:  Patient has no prior history of Echocardiogram examinations.                  CHF, Stroke; Risk Factors:Hypertension.  Sonographer:     Broadus Canes Referring Phys:  [id number] Alica Inks Diagnosing Phys: Lida Reeks Alluri IMPRESSIONS  1. Technically difficult study.  2. Left ventricular ejection fraction, by estimation, is 50 to 55%. The left ventricle has low normal function. The left ventricle has no regional wall motion abnormalities. There is mild left ventricular hypertrophy. Left ventricular diastolic parameters are indeterminate.  3. Right ventricular systolic function is normal. The right ventricular size is normal.  4. Left atrial size was mildly dilated.  5. Echogenic structure on mitral valve- can be degenerative change related  to mitral annular calcification, cannot rule out vegetation. Consider TEE for further evaluation as clinically indicated. Trivial mitral valve regurgitation. Moderate mitral annular calcification.  6. The aortic valve was not well visualized. Aortic valve regurgitation is not visualized. No aortic stenosis is present.  7. The inferior vena cava is normal in size with greater than 50% respiratory variability, suggesting right atrial pressure of 3 mmHg. FINDINGS  Left Ventricle: Left ventricular ejection fraction, by estimation, is 50 to 55%. The left ventricle has low normal function. The left ventricle has no regional wall motion abnormalities. The left ventricular internal cavity size was normal in size. There is mild left ventricular hypertrophy. Left ventricular diastolic parameters are indeterminate. Right Ventricle: The right ventricular size is normal. No increase in right ventricular wall thickness. Right ventricular systolic function is normal. Left Atrium: Left atrial size was mildly dilated. Right Atrium: Right atrial size was normal in size. Pericardium: There is no evidence of pericardial effusion. Mitral Valve: Echogenic structure on mitral valve- can be degenerative change related to mitral annular calcification, cannot rule out vegetation. Consider TEE for further evaluation as clinically indicated. There is mild thickening of the mitral valve leaflet(s). Moderate mitral annular calcification. Trivial mitral valve regurgitation. Tricuspid Valve: The tricuspid valve is not well visualized. Tricuspid valve regurgitation is trivial. Aortic Valve: The aortic valve was not well visualized. Aortic valve regurgitation is not visualized. No aortic stenosis is present. Aortic valve mean gradient measures 7.0 mmHg. Aortic valve peak gradient measures 9.7 mmHg. Aortic valve area, by VTI measures 2.49 cm. Pulmonic Valve: The pulmonic valve was not well visualized. Pulmonic valve regurgitation is not visualized.  Aorta: The aortic root is normal in size and structure. Venous: The inferior vena cava is normal in size with greater than 50% respiratory variability, suggesting right atrial pressure of 3 mmHg. IAS/Shunts: The interatrial septum was not well visualized.  LEFT VENTRICLE PLAX 2D LVIDd:         3.10 cm LVIDs:         2.30 cm LV PW:         0.90 cm LV IVS:        1.30 cm LVOT diam:     2.00 cm LV SV:         64 LV SV Index:   32 LVOT Area:     3.14 cm  RIGHT VENTRICLE RV Basal diam:  3.10 cm RV Mid diam:    2.50 cm RV S prime:     12.60 cm/s TAPSE (M-mode): 1.9 cm LEFT ATRIUM             Index        RIGHT ATRIUM           Index LA diam:  3.70 cm 1.85 cm/m   RA Area:     12.60 cm LA Vol (A2C):   58.6 ml 29.31 ml/m  RA Volume:   28.70 ml  14.36 ml/m LA Vol (A4C):   79.7 ml 39.87 ml/m LA Biplane Vol: 70.9 ml 35.46 ml/m  AORTIC VALVE AV Area (Vmax):    2.78 cm AV Area (Vmean):   2.52 cm AV Area (VTI):     2.49 cm AV Vmax:           156.00 cm/s AV Vmean:          120.000 cm/s AV VTI:            0.259 m AV Peak Grad:      9.7 mmHg AV Mean Grad:      7.0 mmHg LVOT Vmax:         138.00 cm/s LVOT Vmean:        96.400 cm/s LVOT VTI:          0.205 m LVOT/AV VTI ratio: 0.79  AORTA Ao Root diam: 2.20 cm MITRAL VALVE                TRICUSPID VALVE MV Area (PHT): 3.33 cm     TR Peak grad:   14.7 mmHg MV Decel Time: 228 msec     TR Vmax:        192.00 cm/s MV E velocity: 132.00 cm/s                             SHUNTS                             Systemic VTI:  0.20 m                             Systemic Diam: 2.00 cm Joetta Mustache Electronically signed by Joetta Mustache Signature Date/Time: 09/06/2023/5:01:06 PM    Final          Scheduled Meds:  vitamin C  500 mg Oral BID   fluconazole   200 mg Oral Daily   heparin   5,000 Units Subcutaneous Q8H   insulin  aspart  0-15 Units Subcutaneous TID WC   insulin  aspart  0-5 Units Subcutaneous QHS   insulin  aspart  13 Units Subcutaneous TID WC   insulin   glargine-yfgn  35 Units Subcutaneous Daily   multivitamin with minerals  1 tablet Oral Daily   nicotine   14 mg Transdermal Daily   nystatin    Topical TID   polyethylene glycol  17 g Oral Daily   Ensure Shelby Protein  11 oz Oral BID   thiamine   100 mg Oral Daily   Continuous Infusions:  ampicillin -sulbactam (UNASYN ) IV 3 g (09/07/23 0559)     LOS: 5 days       Alphonsus Jeans, MD [facility] Pager [phone]  If 7PM-7AM, please contact night-coverage [email or web address] 09/07/2023, 8:18 AM

## 2023-09-07 NOTE — Discharge Instructions (Signed)
 Shelby Conway

## 2023-09-07 NOTE — Progress Notes (Addendum)
 Date of Admission:  09/02/2023      ID: Shelby Conway is a 68 y.o. female Principal Problem:   Hyperosmolar hyperglycemic state (HHS) (HCC) Active Problems:   Sepsis due to cellulitis (HCC)   Leukocytosis   Altered mental status   Hyperosmolar hyponatremia   Chronic pain   At risk for polypharmacy   Vaginal candidiasis   Morbid obesity (HCC)   Essential hypertension   Bacteremia due to Enterococcus   Candidal intertrigo   Bacteremia    Subjective: Patient is sitting in chair and eating No fever No pain abdomen   Medications:   vitamin C  500 mg Oral BID   fluconazole   200 mg Oral Daily   heparin   5,000 Units Subcutaneous Q8H   insulin  aspart  0-15 Units Subcutaneous TID WC   insulin  aspart  0-5 Units Subcutaneous QHS   insulin  aspart  13 Units Subcutaneous TID WC   insulin  glargine-yfgn  35 Units Subcutaneous Daily   multivitamin with minerals  1 tablet Oral Daily   nicotine   14 mg Transdermal Daily   nystatin    Topical TID   polyethylene glycol  17 g Oral Daily   Ensure Max Protein  11 oz Oral BID   thiamine   100 mg Oral Daily    Objective: Vital signs in last 24 hours: Patient Vitals for the past 24 hrs:  BP Temp Temp src Pulse Resp SpO2 Weight  09/07/23 0813 (!) 114/57 98.3 F (36.8 C) -- 90 18 97 % --  09/07/23 0444 (!) 123/92 98.7 F (37.1 C) Oral (!) 106 18 96 % 108.8 kg  09/06/23 2056 (!) 147/70 98.9 F (37.2 C) Oral (!) 101 18 97 % --  09/06/23 1612 127/65 98.7 F (37.1 C) -- 74 18 99 % --    PHYSICAL EXAM:  General: Alert, cooperative, no distress, appears stated age. Poor dentition multiple lower broken teeth  Lungs: Clear to auscultation bilaterally. No Wheezing or Rhonchi. No rales. Heart: Regular rate and rhythm, no murmur, rub or gallop. Abdomen: Soft, pannus Extremities: atraumatic, no cyanosis. No edema. No clubbing Skin: intertrigo pannus and under breast improving Lymph: Cervical, supraclavicular normal. Neurologic: Grossly  non-focal  Lab Results    Latest Ref Rng & Units 09/07/2023    5:51 AM 09/06/2023    4:24 AM 09/05/2023    3:09 AM  CBC  WBC 4.0 - 10.5 K/uL 6.5  6.2  7.0   Hemoglobin 12.0 - 15.0 g/dL 16.1  09.6  04.5   Hematocrit 36.0 - 46.0 % 39.4  41.1  36.7   Platelets 150 - 400 K/uL 142  125  117        Latest Ref Rng & Units 09/07/2023    5:51 AM 09/06/2023    4:24 AM 09/05/2023    3:09 AM  CMP  Glucose 70 - 99 mg/dL 409  811  914   BUN 8 - 23 mg/dL 19  15  15    Creatinine 0.44 - 1.00 mg/dL 7.82  9.56  2.13   Sodium 135 - 145 mmol/L 129  133  129   Potassium 3.5 - 5.1 mmol/L 4.2  3.8  4.1   Chloride 98 - 111 mmol/L 94  95  94   CO2 22 - 32 mmol/L 26  29  26    Calcium 8.9 - 10.3 mg/dL 9.0  8.7  8.2       Microbiology: [facility] 5/1 11:45 AM enterococcus, Peptostreptococcus and actinomyces 5/ 1 blood culture  from 12:17 PM no growth 09/03/23 BC NG Studies/Results: ECHOCARDIOGRAM COMPLETE Result Date: 09/06/2023    ECHOCARDIOGRAM REPORT   Patient Name:   Shelby Conway Date of Exam: 09/06/2023 Medical Rec #:  [id number]        Height:       62.0 in Accession #:    [id number]       Weight:       222.2 lb Date of Birth:  Mar 23, 1956        BSA:          1.999 m Patient Age:    67 years         BP:           111/80 mmHg Patient Gender: F                HR:           98 bpm. Exam Location:  [facility] Procedure: 2D Echo, Cardiac Doppler and Color Doppler (Both Spectral and Color            Flow Doppler were utilized during procedure). Indications:     Bacteremia R78.81  History:         Patient has no prior history of Echocardiogram examinations.                  CHF, Stroke; Risk Factors:Hypertension.  Sonographer:     Broadus Canes Referring Phys:  [id number] Alica Inks Diagnosing Phys: Lida Reeks Alluri IMPRESSIONS  1. Technically difficult study.  2. Left ventricular ejection fraction, by estimation, is 50 to 55%. The left ventricle has low normal function. The left ventricle has no regional wall motion abnormalities.  There is mild left ventricular hypertrophy. Left ventricular diastolic parameters are indeterminate.  3. Right ventricular systolic function is normal. The right ventricular size is normal.  4. Left atrial size was mildly dilated.  5. Echogenic structure on mitral valve- can be degenerative change related to mitral annular calcification, cannot rule out vegetation. Consider TEE for further evaluation as clinically indicated. Trivial mitral valve regurgitation. Moderate mitral annular calcification.  6. The aortic valve was not well visualized. Aortic valve regurgitation is not visualized. No aortic stenosis is present.  7. The inferior vena cava is normal in size with greater than 50% respiratory variability, suggesting right atrial pressure of 3 mmHg. FINDINGS  Left Ventricle: Left ventricular ejection fraction, by estimation, is 50 to 55%. The left ventricle has low normal function. The left ventricle has no regional wall motion abnormalities. The left ventricular internal cavity size was normal in size. There is mild left ventricular hypertrophy. Left ventricular diastolic parameters are indeterminate. Right Ventricle: The right ventricular size is normal. No increase in right ventricular wall thickness. Right ventricular systolic function is normal. Left Atrium: Left atrial size was mildly dilated. Right Atrium: Right atrial size was normal in size. Pericardium: There is no evidence of pericardial effusion. Mitral Valve: Echogenic structure on mitral valve- can be degenerative change related to mitral annular calcification, cannot rule out vegetation. Consider TEE for further evaluation as clinically indicated. There is mild thickening of the mitral valve leaflet(s). Moderate mitral annular calcification. Trivial mitral valve regurgitation. Tricuspid Valve: The tricuspid valve is not well visualized. Tricuspid valve regurgitation is trivial. Aortic Valve: The aortic valve was not well visualized. Aortic valve  regurgitation is not visualized. No aortic stenosis is present. Aortic valve mean gradient measures 7.0 mmHg. Aortic valve peak gradient measures 9.7 mmHg. Aortic valve area, by VTI measures  2.49 cm. Pulmonic Valve: The pulmonic valve was not well visualized. Pulmonic valve regurgitation is not visualized. Aorta: The aortic root is normal in size and structure. Venous: The inferior vena cava is normal in size with greater than 50% respiratory variability, suggesting right atrial pressure of 3 mmHg. IAS/Shunts: The interatrial septum was not well visualized.  LEFT VENTRICLE PLAX 2D LVIDd:         3.10 cm LVIDs:         2.30 cm LV PW:         0.90 cm LV IVS:        1.30 cm LVOT diam:     2.00 cm LV SV:         64 LV SV Index:   32 LVOT Area:     3.14 cm  RIGHT VENTRICLE RV Basal diam:  3.10 cm RV Mid diam:    2.50 cm RV S prime:     12.60 cm/s TAPSE (M-mode): 1.9 cm LEFT ATRIUM             Index        RIGHT ATRIUM           Index LA diam:        3.70 cm 1.85 cm/m   RA Area:     12.60 cm LA Vol (A2C):   58.6 ml 29.31 ml/m  RA Volume:   28.70 ml  14.36 ml/m LA Vol (A4C):   79.7 ml 39.87 ml/m LA Biplane Vol: 70.9 ml 35.46 ml/m  AORTIC VALVE AV Area (Vmax):    2.78 cm AV Area (Vmean):   2.52 cm AV Area (VTI):     2.49 cm AV Vmax:           156.00 cm/s AV Vmean:          120.000 cm/s AV VTI:            0.259 m AV Peak Grad:      9.7 mmHg AV Mean Grad:      7.0 mmHg LVOT Vmax:         138.00 cm/s LVOT Vmean:        96.400 cm/s LVOT VTI:          0.205 m LVOT/AV VTI ratio: 0.79  AORTA Ao Root diam: 2.20 cm MITRAL VALVE                TRICUSPID VALVE MV Area (PHT): 3.33 cm     TR Peak grad:   14.7 mmHg MV Decel Time: 228 msec     TR Vmax:        192.00 cm/s MV E velocity: 132.00 cm/s                             SHUNTS                             Systemic VTI:  0.20 m                             Systemic Diam: 2.00 cm Joetta Mustache Electronically signed by Joetta Mustache Signature Date/Time: 09/06/2023/5:01:06 PM     Final      Assessment/Plan: Hyperosmolar hyperglycemic presentation  new onset- resolved Management as per primary team Hyponatremia  Dehydration Encephalopathy from the above  resolved ? Polymicrobial bacteremia including  Enterococcus bacteremia, Peptostreptococcus and actinomyces 1/2 culture-these are GI organism.  She has poor dentition and Peptostreptococcus and actinomyces s can be present in the dental plaques/mouth Enterococcus is not a common organism to be present in dental caries Cases of dental infection with Enterococcus has been reported Other source of the 3 organisms is GI tract.  2D echo was done today and it shows mitral valve questionable vegetation and she is going to get TEE tomorrow Will also get a CT of the abdomen and pelvis with contrast to rule out any source for the above polymicrobial infection Patient is currently on Unasyn .  Will add ceftriaxone  AKI- resolved   Severe intertriginous candida with possible secondary cellulitis On  fluconazole  200mg   every day x-7 days   Discussed the management with the patient and hospitalist

## 2023-09-07 NOTE — Plan of Care (Signed)
 Shelby Conway

## 2023-09-07 NOTE — Inpatient Diabetes Management (Addendum)
 Inpatient Diabetes Program Recommendations  AACE/ADA: New Consensus Statement on Inpatient Glycemic Control   Target Ranges:  Prepandial:   less than 140 mg/dL      Peak postprandial:   less than 180 mg/dL (1-2 hours)      Critically ill patients:  140 - 180 mg/dL    Latest Reference Range & Units 09/06/23 07:51 09/06/23 11:41 09/06/23 16:53 09/06/23 20:56 09/07/23 07:44  Glucose-Capillary 70 - 99 mg/dL 161 (H) 096 (H) 045 (H) 164 (H) 238 (H)   Review of Glycemic Control  Diabetes history: No; new DM2 dx this admission Outpatient Diabetes medications: NA Current orders for Inpatient glycemic control: Semglee  30 units daily, Novolog  13 units TID with meals, Novolog  0-15 units TID with meals, Novolog  0-5 units QHS   Inpatient Diabetes Program Recommendations:    Insulin : CBG 238 mg/dl this morning. Please consider increasing Semglee  to 35 units daily.  Discharge Recommendations: Long acting recommendations: Insulin  Glargine (LANTUS ) Solostar Pen 35 units daily  Short acting recommendations:  Meal + Correction coverage Insulin  aspart (NOVOLOG ) FlexPen  Sensitive Scale.  13 units for meal coverage plus correction Supply/Referral recommendations: Glucometer Test strips Lancet device Lancets Pen needles - standard Referral to Nutrition & Diab Services   Use Adult Diabetes Insulin  Treatment Post Discharge order set.  Addendum 09/07/23@11 :50-Spoke with patient at bedside. Reviewed DM basics for DM management. Asked patient to demonstrate using the insulin  pen but patient reports she does not remember the steps reviewed yesterday. Demonstrated how to use the insulin  pen and patient was able to demonstrate but required step by step directions. Informed patient that I spoke with her son Donavon Fudge yesterday and reviewed insulin  pen verbally and asked him to watch youtube video on how to use an insulin  pen. Patient states that Donavon Fudge told her I spoke with him yesterday and he is fine with giving her  insulin  injections with insulin  pen. Asked that she allow Donavon Fudge to work with her at home so she can learn how to give herself the insulin .  Encouraged patient to give her own insulin  injections while inpatient.  Reviewed glucose goals, hyperglycemia, hypoglycemia, and treatment for both.  Patient verbalized understanding of information and has no questions at this time.  Thanks, Beacher Limerick, RN, MSN, CDCES Diabetes Coordinator Inpatient Diabetes Program [phone] (Team Pager from 8am to 5pm)

## 2023-09-08 ENCOUNTER — Encounter
Admission: EM | Disposition: A | Discharge status: Home Health | Payer: Self-pay | Source: Home / Self Care | Attending: Internal Medicine

## 2023-09-08 ENCOUNTER — Inpatient Hospital Stay
Admit: 2023-09-08 | Discharge: 2023-09-08 | Disposition: A | Discharge status: Home / Self Care | Attending: Physician Assistant

## 2023-09-08 ENCOUNTER — Inpatient Hospital Stay: Admitting: Registered Nurse

## 2023-09-08 ENCOUNTER — Inpatient Hospital Stay

## 2023-09-08 DIAGNOSIS — E1100 Type 2 diabetes mellitus with hyperosmolarity without nonketotic hyperglycemic-hyperosmolar coma (NKHHC): Secondary | ICD-10-CM | POA: Diagnosis not present

## 2023-09-08 DIAGNOSIS — B372 Candidiasis of skin and nail: Secondary | ICD-10-CM | POA: Diagnosis not present

## 2023-09-08 DIAGNOSIS — E871 Hypo-osmolality and hyponatremia: Secondary | ICD-10-CM | POA: Diagnosis not present

## 2023-09-08 DIAGNOSIS — B952 Enterococcus as the cause of diseases classified elsewhere: Secondary | ICD-10-CM | POA: Diagnosis not present

## 2023-09-08 DIAGNOSIS — I361 Nonrheumatic tricuspid (valve) insufficiency: Secondary | ICD-10-CM

## 2023-09-08 DIAGNOSIS — I340 Nonrheumatic mitral (valve) insufficiency: Secondary | ICD-10-CM

## 2023-09-08 DIAGNOSIS — R7881 Bacteremia: Secondary | ICD-10-CM | POA: Diagnosis not present

## 2023-09-08 HISTORY — PX: TEE WITHOUT CARDIOVERSION: SHX5443

## 2023-09-08 LAB — BASIC METABOLIC PANEL WITH GFR
Anion gap: 8 (ref 5–15)
BUN: 15 mg/dL (ref 8–23)
CO2: 28 mmol/L (ref 22–32)
Calcium: 9.2 mg/dL (ref 8.9–10.3)
Chloride: 96 mmol/L — ABNORMAL LOW (ref 98–111)
Creatinine, Ser: 0.71 mg/dL (ref 0.44–1.00)
GFR, Estimated: >60 mL/min (ref >60)
Glucose, Bld: 153 mg/dL — ABNORMAL HIGH (ref 70–99)
Potassium: 4.3 mmol/L (ref 3.5–5.1)
Sodium: 132 mmol/L — ABNORMAL LOW (ref 135–145)

## 2023-09-08 LAB — CULTURE, BLOOD (ROUTINE X 2)
Culture: NO GROWTH
Culture: NO GROWTH
Special Requests: ADEQUATE

## 2023-09-08 LAB — GLUCOSE, CAPILLARY
Glucose-Capillary: 156 mg/dL — ABNORMAL HIGH (ref 70–99)
Glucose-Capillary: 156 mg/dL — ABNORMAL HIGH (ref 70–99)
Glucose-Capillary: 134 mg/dL — ABNORMAL HIGH (ref 70–99)
Glucose-Capillary: 131 mg/dL — ABNORMAL HIGH (ref 70–99)
Glucose-Capillary: 136 mg/dL — ABNORMAL HIGH (ref 70–99)

## 2023-09-08 LAB — PHOSPHORUS: Phosphorus: 3.7 mg/dL (ref 2.5–4.6)

## 2023-09-08 LAB — CBC
HCT: 36.7 % (ref 36.0–46.0)
Hemoglobin: 12.4 g/dL (ref 12.0–15.0)
MCH: 30.8 pg (ref 26.0–34.0)
MCHC: 33.8 g/dL (ref 30.0–36.0)
MCV: 91.3 fL (ref 80.0–100.0)
Platelets: 132 10*3/uL — ABNORMAL LOW (ref 150–400)
RBC: 4.02 MIL/uL (ref 3.87–5.11)
RDW: 13.5 % (ref 11.5–15.5)
WBC: 5.9 10*3/uL (ref 4.0–10.5)
nRBC: 0 % (ref 0.0–0.2)

## 2023-09-08 LAB — MAGNESIUM: Magnesium: 1.6 mg/dL — ABNORMAL LOW (ref 1.7–2.4)

## 2023-09-08 LAB — ECHO TEE

## 2023-09-08 LAB — BRAIN NATRIURETIC PEPTIDE: B Natriuretic Peptide: 185.8 pg/mL — ABNORMAL HIGH (ref 0.0–100.0)

## 2023-09-08 SURGERY — ECHOCARDIOGRAM, TRANSESOPHAGEAL
Anesthesia: General

## 2023-09-08 MED ORDER — PROPOFOL 10 MG/ML IV BOLUS
INTRAVENOUS | Status: AC
  Filled 2023-09-08: qty 40

## 2023-09-08 MED ORDER — PHENYLEPHRINE HCL (PRESSORS) 10 MG/ML IV SOLN
INTRAVENOUS | Status: DC | PRN
  Administered 2023-09-08: 160 ug via INTRAVENOUS
  Administered 2023-09-08 (×2): 80 ug via INTRAVENOUS

## 2023-09-08 MED ORDER — DROPERIDOL 2.5 MG/ML IJ SOLN: 0.6250 mg | Freq: Once | INTRAMUSCULAR | Status: DC | PRN

## 2023-09-08 MED ORDER — BUTAMBEN-TETRACAINE-BENZOCAINE 2-2-14 % EX AERO
4.0000 | INHALATION_SPRAY | Freq: Once | CUTANEOUS | Status: AC
  Administered 2023-09-08: 4 via TOPICAL
  Filled 2023-09-08: qty 20

## 2023-09-08 MED ORDER — IOHEXOL 350 MG/ML SOLN
100.0000 mL | Freq: Once | INTRAVENOUS | Status: AC | PRN
  Administered 2023-09-08: 100 mL via INTRAVENOUS

## 2023-09-08 MED ORDER — BUTAMBEN-TETRACAINE-BENZOCAINE 2-2-14 % EX AERO
INHALATION_SPRAY | CUTANEOUS | Status: AC
  Filled 2023-09-08: qty 5

## 2023-09-08 MED ORDER — PROPOFOL 500 MG/50ML IV EMUL
INTRAVENOUS | Status: DC | PRN
  Administered 2023-09-08: 10 mg via INTRAVENOUS
  Administered 2023-09-08 (×2): 20 mg via INTRAVENOUS
  Administered 2023-09-08: 60 mg via INTRAVENOUS

## 2023-09-08 MED ORDER — OXYCODONE HCL 5 MG PO TABS: 5.0000 mg | ORAL_TABLET | Freq: Once | ORAL | Status: DC | PRN

## 2023-09-08 MED ORDER — ACETAMINOPHEN 10 MG/ML IV SOLN: 1000.0000 mg | Freq: Once | INTRAVENOUS | Status: DC | PRN

## 2023-09-08 MED ORDER — LIDOCAINE VISCOUS HCL 2 % MT SOLN
15.0000 mL | OROMUCOSAL | Status: AC
  Administered 2023-09-08: 15 mL via OROMUCOSAL
  Filled 2023-09-08: qty 15

## 2023-09-08 MED ORDER — SODIUM CHLORIDE 0.9 % IV SOLN: INTRAVENOUS | Status: DC

## 2023-09-08 MED ORDER — OXYCODONE HCL 5 MG/5ML PO SOLN: 5.0000 mg | Freq: Once | ORAL | Status: DC | PRN

## 2023-09-08 MED ORDER — LIDOCAINE HCL (CARDIAC) PF 100 MG/5ML IV SOSY
PREFILLED_SYRINGE | INTRAVENOUS | Status: DC | PRN
  Administered 2023-09-08: 50 mg via INTRAVENOUS

## 2023-09-08 MED ORDER — FENTANYL CITRATE (PF) 100 MCG/2ML IJ SOLN: 25.0000 ug | INTRAMUSCULAR | Status: DC | PRN

## 2023-09-08 MED ORDER — LIDOCAINE HCL (PF) 2 % IJ SOLN
INTRAMUSCULAR | Status: AC
  Filled 2023-09-08: qty 5

## 2023-09-08 MED ORDER — IOHEXOL 300 MG/ML  SOLN
30.0000 mL | Freq: Once | INTRAMUSCULAR | Status: AC | PRN
  Administered 2023-09-08: 30 mL via ORAL

## 2023-09-08 MED ORDER — FUROSEMIDE 10 MG/ML IJ SOLN
40.0000 mg | Freq: Every day | INTRAMUSCULAR | Status: DC
  Administered 2023-09-08 – 2023-09-09 (×2): 40 mg via INTRAVENOUS
  Filled 2023-09-08 (×2): qty 4

## 2023-09-08 MED ORDER — LIDOCAINE VISCOUS HCL 2 % MT SOLN
OROMUCOSAL | Status: AC
  Filled 2023-09-08: qty 15

## 2023-09-08 NOTE — Transfer of Care (Signed)
 Immediate Anesthesia Transfer of Care Note  Patient: Shelby Conway  Procedure(s) Performed: ECHOCARDIOGRAM, TRANSESOPHAGEAL  Patient Location: PACU and Nursing Unit  Anesthesia Type:General  Level of Consciousness: drowsy and patient cooperative  Airway & Oxygen Therapy: Patient Spontanous Breathing and Patient connected to nasal cannula oxygen  Post-op Assessment: Report given to RN and Post -op Vital signs reviewed and stable  Post vital signs: Reviewed and stable  Last Vitals:  Vitals Value Taken Time  BP 99/43 09/08/23 0820  Temp    Pulse    Resp 22 09/08/23 0820  SpO2 99 % 09/08/23 0820    Last Pain:  Vitals:   09/08/23 0714  TempSrc: Oral  PainSc: 0-No pain         Complications: No notable events documented.

## 2023-09-08 NOTE — Progress Notes (Signed)
 OT Cancellation Note  Patient Details Name: Shelby Conway MRN: [id number] DOB: 12/08/1955   Cancelled Treatment:    Reason Eval/Treat Not Completed: Patient at procedure or test/ unavailable. Pt off unit for TEE procedure. Will re-attempt as able and when medially appropriate.    Adoni Greenough L. Lindsey Hommel, OTR/L  09/08/23, 8:34 AM

## 2023-09-08 NOTE — Progress Notes (Signed)
 Progress Note    Shelby Conway  NWG:[id number] DOB: 1955/09/23  DOA: 09/02/2023 PCP: [facility]      Brief Narrative:    Medical records reviewed and are as summarized below:  Shelby Conway is a 68 y.o. female with medical history significant for hypertension, hyperlipidemia, obesity, stroke in 2005, CHF, who presented to the hospital because of altered mental status.   Vitals in the ED showed T of 98, respiration rate of 14, heart rate of 113, blood pressure 96/78, improved to 125/65, SpO2 of 93% on 3 L nasal cannula.   Serum sodium is 122, potassium 4.4, chloride 85, bicarb 21, BUN of 26, serum creatinine 1.30, EGFR 45, nonfasting blood glucose 594, WBC 13.7, hemoglobin of 15.8, platelets of 229.   Anion gap was elevated at 16.  Lactic acid was 5.6.  UA was negative for ketones, leukocytes, nitrates.       Assessment/Plan:   Principal Problem:   Hyperosmolar hyperglycemic state (HHS) (HCC) Active Problems:   Sepsis due to cellulitis (HCC)   Altered mental status   Chronic pain   Essential hypertension   Hyperosmolar hyponatremia   Leukocytosis   Vaginal candidiasis   Morbid obesity (HCC)   At risk for polypharmacy   Bacteremia due to Enterococcus   Candidal intertrigo   Bacteremia   Nutrition Problem: Inadequate oral intake Etiology: acute illness  Signs/Symptoms: per patient/family report   Body mass index is 43.95 kg/m.  (Morbid obesity)    Secondary to severe sepsis secondary to polymicrobial bacteremia: Blood culture showed Enterococcus faecalis, Peptostreptococcus assacchorolyticus and actinomyces species.   No evidence of vegetation on TEE. Peptostreptococcus and actinomyces 1/2 culture-these are GI organism.  She has poor dentition and Peptostreptococcus and actinomyces s can be present in the dental plaques/mouth  ID recommended CT abdomen pelvis with IV contrast for further evaluation. Continue IV Unasyn .  No growth on  repeat blood cultures.   Type II DM with severe hyperglycemia, hyperosmolar hyperglycemic state: Continue Lantus  and NovoLog . Hemoglobin A1c 15.5 She has been evaluated by the diabetic coordinator.   Severe intertriginous Candida with possible secondary cellulitis: Completed 7 days of fluconazole  on 09/08/2023   Fluid overload: Patient reports history of CHF and needs to take Lasix in the past.  Notably, patient had IV fluids initially for sepsis and HHS. Start IV Lasix.  Suspected acute on chronic diastolic CHF. 2D echo and TEE showed preserved EF. Check BNP   AKI: Resolved Hyponatremia: Improved   Acute metabolic encephalopathy: Resolved   Diet Order             Diet heart healthy/carb modified Room service appropriate? Yes; Fluid consistency: Thin  Diet effective now                            Consultants: ID specialist  Procedures: TEE on 09/08/2023    Medications:    vitamin C  500 mg Oral BID   heparin   5,000 Units Subcutaneous Q8H   insulin  aspart  0-15 Units Subcutaneous TID WC   insulin  aspart  0-5 Units Subcutaneous QHS   insulin  aspart  13 Units Subcutaneous TID WC   insulin  glargine-yfgn  35 Units Subcutaneous Daily   multivitamin with minerals  1 tablet Oral Daily   nicotine   14 mg Transdermal Daily   nystatin    Topical TID   polyethylene glycol  17 g Oral Daily   Ensure Max Protein  11 oz Oral BID   thiamine   100 mg Oral Daily   Continuous Infusions:  ampicillin -sulbactam (UNASYN ) IV 3 g (09/08/23 0541)     Anti-infectives (From admission, onward)    Start     Dose/Rate Route Frequency Ordered Stop   09/07/23 2200  cefTRIAXone (ROCEPHIN) 2 g in sodium chloride  0.9 % 100 mL IVPB  Status:  Discontinued        2 g 200 mL/hr over 30 Minutes Intravenous Every 12 hours 09/07/23 1536 09/08/23 0921   09/05/23 1200  fluconazole  (DIFLUCAN ) tablet 150 mg  Status:  Discontinued        150 mg Oral every 72 hours 09/02/23 1501 09/03/23 1426    09/03/23 1600  fluconazole  (DIFLUCAN ) tablet 200 mg        200 mg Oral Daily 09/03/23 1456 09/08/23 0943   09/03/23 1000  Ampicillin -Sulbactam (UNASYN ) 3 g in sodium chloride  0.9 % 100 mL IVPB        3 g 200 mL/hr over 30 Minutes Intravenous Every 6 hours 09/03/23 0824     09/03/23 0845  ampicillin  (OMNIPEN) 2 g in sodium chloride  0.9 % 100 mL IVPB  Status:  Discontinued        2 g 300 mL/hr over 20 Minutes Intravenous Every 4 hours 09/03/23 0757 09/03/23 0824   09/03/23 0000  ceFEPIme  (MAXIPIME ) 2 g in sodium chloride  0.9 % 100 mL IVPB  Status:  Discontinued        2 g 200 mL/hr over 30 Minutes Intravenous Every 12 hours 09/02/23 1505 09/03/23 0757   09/02/23 1600  vancomycin  (VANCOCIN ) IVPB 1000 mg/200 mL premix        1,000 mg 200 mL/hr over 60 Minutes Intravenous  Once 09/02/23 1509 09/02/23 1710   09/02/23 1514  vancomycin  variable dose per unstable renal function (pharmacist dosing)  Status:  Discontinued         Does not apply See admin instructions 09/02/23 1514 09/03/23 0757   09/02/23 1200  ceFEPIme  (MAXIPIME ) 2 g in sodium chloride  0.9 % 100 mL IVPB        2 g 200 mL/hr over 30 Minutes Intravenous  Once 09/02/23 1150 09/02/23 1300   09/02/23 1200  metroNIDAZOLE  (FLAGYL ) IVPB 500 mg        500 mg 100 mL/hr over 60 Minutes Intravenous  Once 09/02/23 1150 09/02/23 1421   09/02/23 1200  vancomycin  (VANCOCIN ) IVPB 1000 mg/200 mL premix        1,000 mg 200 mL/hr over 60 Minutes Intravenous  Once 09/02/23 1150 09/02/23 1600   09/02/23 1200  fluconazole  (DIFLUCAN ) tablet 150 mg        150 mg Oral  Once 09/02/23 1150 09/02/23 1230              Family Communication/Anticipated D/C date and plan/Code Status   DVT prophylaxis: heparin  injection 5,000 Units Start: 09/02/23 1415 SCDs Start: 09/02/23 1408     Code Status: Full Code  Family Communication: None Disposition Plan: Plan to discharge home   Status is: Inpatient Remains inpatient appropriate because:  Bacteremia       Subjective:   Interval events noted.  She complains of swelling in bilateral legs.  She said she noticed this a few days ago.  She has had history of leg swelling in the past and used to take Lasix.  Objective:    Vitals:   09/08/23 0835 09/08/23 0845 09/08/23 0859 09/08/23 0922  BP:  (!) 107/59 (!) 88/51 Aaron Aas)  139/59  Pulse: 88 89 90 79  Resp: 17 16 15 17   Temp:    98.5 F (36.9 C)  TempSrc:    Oral  SpO2: 98% 97% 94% 100%  Weight:      Height:       No data found.   Intake/Output Summary (Last 24 hours) at 09/08/2023 1150 Last data filed at 09/08/2023 0814 Gross per 24 hour  Intake 100 ml  Output --  Net 100 ml   Filed Weights   09/07/23 0444 09/08/23 0513 09/08/23 0714  Weight: 108.8 kg 109 kg 109 kg    Exam:  GEN: NAD SKIN: Warm and dry EYES: No pallor or icterus ENT: MMM, poor dentition CV: RRR PULM: CTA B ABD: soft, obese/distended, NT, +BS CNS: AAO x 3, non focal EXT: Significant bilateral leg edema.  Some chronic erythematous changes on bilateral legs.  No tenderness.         Data Reviewed:   I have personally reviewed following labs and imaging studies:  Labs: Labs show the following:   Basic Metabolic Panel: Recent Labs  Lab 09/03/23 0220 09/03/23 0824 09/04/23 0319 09/05/23 0309 09/06/23 0424 09/07/23 0551 09/08/23 0425  NA 127*   < > 129* 129* 133* 129* 132*  K 3.3*   < > 3.8 4.1 3.8 4.2 4.3  CL 93*   < > 96* 94* 95* 94* 96*  CO2 28   < > 25 26 29 26 28   GLUCOSE 164*   < > 244* 277* 195* 235* 153*  BUN 16   < > 11 15 15 19 15   CREATININE 0.70   < > 0.56 0.59 0.57 0.63 0.71  CALCIUM 8.8*   < > 8.2* 8.2* 8.7* 9.0 9.2  MG 2.6*  --  1.8 1.7 1.6*  --  1.6*  PHOS 2.0*  --  2.1* 2.7 2.4*  --  3.7   < > = values in this interval not displayed.   GFR Estimated Creatinine Clearance: 78.3 mL/min (by C-G formula based on SCr of 0.71 mg/dL). Liver Function Tests: Recent Labs  Lab 09/02/23 1135  AST 38  ALT 29   ALKPHOS 98  BILITOT 1.8*  PROT 7.7  ALBUMIN 3.7   No results for input(s): "LIPASE", "AMYLASE" in the last 168 hours. No results for input(s): "AMMONIA" in the last 168 hours. Coagulation profile Recent Labs  Lab 09/02/23 1353  INR 1.3*    CBC: Recent Labs  Lab 09/02/23 1135 09/03/23 0220 09/04/23 0319 09/05/23 0309 09/06/23 0424 09/07/23 0551 09/08/23 0425  WBC 13.7*   < > 8.1 7.0 6.2 6.5 5.9  NEUTROABS 9.9*  --   --   --   --   --   --   HGB 15.8*   < > 13.2 12.4 13.7 13.1 12.4  HCT 45.5   < > 38.9 36.7 41.1 39.4 36.7  MCV 87.8   < > 89.0 89.1 90.3 90.0 91.3  PLT 229   < > 111* 117* 125* 142* 132*   < > = values in this interval not displayed.   Cardiac Enzymes: No results for input(s): "CKTOTAL", "CKMB", "CKMBINDEX", "TROPONINI" in the last 168 hours. BNP (last 3 results) No results for input(s): "PROBNP" in the last 8760 hours. CBG: Recent Labs  Lab 09/07/23 1132 09/07/23 1638 09/07/23 2049 09/08/23 0714 09/08/23 0925  GLUCAP 241* 210* 175* 156* 156*   D-Dimer: No results for input(s): "DDIMER" in the last 72 hours. Hgb A1c: No results  for input(s): "HGBA1C" in the last 72 hours. Lipid Profile: No results for input(s): "CHOL", "HDL", "LDLCALC", "TRIG", "CHOLHDL", "LDLDIRECT" in the last 72 hours. Thyroid function studies: No results for input(s): "TSH", "T4TOTAL", "T3FREE", "THYROIDAB" in the last 72 hours.  Invalid input(s): "FREET3" Anemia work up: No results for input(s): "VITAMINB12", "FOLATE", "FERRITIN", "TIBC", "IRON", "RETICCTPCT" in the last 72 hours. Sepsis Labs: Recent Labs  Lab 09/02/23 1149 09/02/23 1353 09/03/23 0220 09/05/23 0309 09/06/23 0424 09/07/23 0551 09/08/23 0425  WBC  --   --    < > 7.0 6.2 6.5 5.9  LATICACIDVEN 5.6* 3.4*  --   --   --   --   --    < > = values in this interval not displayed.    Microbiology Recent Results (from the past 240 hours)  Blood Culture (routine x 2)     Status: Abnormal   Collection  Time: 09/02/23 11:45 AM   Specimen: BLOOD  Result Value Ref Range Status   Specimen Description   Final    BLOOD BLOOD LEFT HAND Performed at [facility], [address], [place], [place] [address]    Special Requests   Final    BOTTLES DRAWN AEROBIC AND ANAEROBIC Blood Culture results may not be optimal due to an inadequate volume of blood received in culture bottles Performed at [facility], [address] [place], [place] [address]    Culture  Setup Time   Final    GRAM POSITIVE COCCI AEROBIC BOTTLE ONLY CRITICAL RESULT CALLED TO, READ BACK BY AND VERIFIED WITH: NATHAN BELEUE @0258  ON 09/03/23 SKL ANAEROBIC BOTTLE ONLY GRAM POSITIVE RODS CRITICAL RESULT CALLED TO, READ BACK BY AND VERIFIED WITH: TREY GREENWOOD AT 1002 09/04/23.PMF    Culture (A)  Final    ENTEROCOCCUS FAECALIS PEPTOSTREPTOCOCCUS ASACCHAROLYTICUS ACTINOMYCES SPECIES Standardized susceptibility testing for this organism is not available. Performed at [facility], [address] [place], [place] [address]    Report Status 09/07/2023 FINAL  Final   Organism ID, Bacteria ENTEROCOCCUS FAECALIS  Final      Susceptibility   Enterococcus faecalis - MIC*    AMPICILLIN  <=2 SENSITIVE Sensitive     VANCOMYCIN  2 SENSITIVE Sensitive     GENTAMICIN SYNERGY SENSITIVE Sensitive     * ENTEROCOCCUS FAECALIS  Blood Culture ID Panel (Reflexed)     Status: Abnormal   Collection Time: 09/02/23 11:45 AM  Result Value Ref Range Status   Enterococcus faecalis DETECTED (A) NOT DETECTED Final    Comment: CRITICAL RESULT CALLED TO, READ BACK BY AND VERIFIED WITH: NATHAN BELEUE @0258  ON 09/03/23 SKL    Enterococcus Faecium NOT DETECTED NOT DETECTED Final   Listeria monocytogenes NOT DETECTED NOT DETECTED Final   Staphylococcus species NOT DETECTED NOT DETECTED Final   Staphylococcus aureus (BCID) NOT DETECTED NOT DETECTED Final   Staphylococcus epidermidis NOT DETECTED NOT DETECTED Final   Staphylococcus  lugdunensis NOT DETECTED NOT DETECTED Final   Streptococcus species NOT DETECTED NOT DETECTED Final   Streptococcus agalactiae NOT DETECTED NOT DETECTED Final   Streptococcus pneumoniae NOT DETECTED NOT DETECTED Final   Streptococcus pyogenes NOT DETECTED NOT DETECTED Final   A.calcoaceticus-baumannii NOT DETECTED NOT DETECTED Final   Bacteroides fragilis NOT DETECTED NOT DETECTED Final   Enterobacterales NOT DETECTED NOT DETECTED Final   Enterobacter cloacae complex NOT DETECTED NOT DETECTED Final   Escherichia coli NOT DETECTED NOT DETECTED Final   Klebsiella aerogenes NOT DETECTED NOT DETECTED Final   Klebsiella oxytoca  NOT DETECTED NOT DETECTED Final   Klebsiella pneumoniae NOT DETECTED NOT DETECTED Final   Proteus species NOT DETECTED NOT DETECTED Final   Salmonella species NOT DETECTED NOT DETECTED Final   Serratia marcescens NOT DETECTED NOT DETECTED Final   Haemophilus influenzae NOT DETECTED NOT DETECTED Final   Neisseria meningitidis NOT DETECTED NOT DETECTED Final   Pseudomonas aeruginosa NOT DETECTED NOT DETECTED Final   Stenotrophomonas maltophilia NOT DETECTED NOT DETECTED Final   Candida albicans NOT DETECTED NOT DETECTED Final   Candida auris NOT DETECTED NOT DETECTED Final   Candida glabrata NOT DETECTED NOT DETECTED Final   Candida krusei NOT DETECTED NOT DETECTED Final   Candida parapsilosis NOT DETECTED NOT DETECTED Final   Candida tropicalis NOT DETECTED NOT DETECTED Final   Cryptococcus neoformans/gattii NOT DETECTED NOT DETECTED Final   Vancomycin  resistance NOT DETECTED NOT DETECTED Final    Comment: Performed at [facility], [address] [place], [place] [address]  Resp panel by RT-PCR (RSV, Flu A&B, Covid) Anterior Nasal Swab     Status: None   Collection Time: 09/02/23 12:17 PM   Specimen: Anterior Nasal Swab  Result Value Ref Range Status   SARS Coronavirus 2 by RT PCR NEGATIVE NEGATIVE Final    Comment: (NOTE) SARS-CoV-2 target nucleic  acids are NOT DETECTED.  The SARS-CoV-2 RNA is generally detectable in upper respiratory specimens during the acute phase of infection. The lowest concentration of SARS-CoV-2 viral copies this assay can detect is 138 copies/mL. A negative result does not preclude SARS-Cov-2 infection and should not be used as the sole basis for treatment or other patient management decisions. A negative result may occur with  improper specimen collection/handling, submission of specimen other than nasopharyngeal swab, presence of viral mutation(s) within the areas targeted by this assay, and inadequate number of viral copies(<138 copies/mL). A negative result must be combined with clinical observations, patient history, and epidemiological information. The expected result is Negative.  Fact Sheet for Patients:  BloggerCourse.com  Fact Sheet for Healthcare Providers:  SeriousBroker.it  This test is no t yet approved or cleared by the [place]  FDA and  has been authorized for detection and/or diagnosis of SARS-CoV-2 by FDA under an Emergency Use Authorization (EUA). This EUA will remain  in effect (meaning this test can be used) for the duration of the COVID-19 declaration under Section 564(b)(1) of the Act, 21 U.S.C.section 360bbb-3(b)(1), unless the authorization is terminated  or revoked sooner.       Influenza A by PCR NEGATIVE NEGATIVE Final   Influenza B by PCR NEGATIVE NEGATIVE Final    Comment: (NOTE) The Xpert Xpress SARS-CoV-2/FLU/RSV plus assay is intended as an aid in the diagnosis of influenza from Nasopharyngeal swab specimens and should not be used as a sole basis for treatment. Nasal washings and aspirates are unacceptable for Xpert Xpress SARS-CoV-2/FLU/RSV testing.  Fact Sheet for Patients: BloggerCourse.com  Fact Sheet for Healthcare Providers: SeriousBroker.it  This  test is not yet approved or cleared by the [place]  FDA and has been authorized for detection and/or diagnosis of SARS-CoV-2 by FDA under an Emergency Use Authorization (EUA). This EUA will remain in effect (meaning this test can be used) for the duration of the COVID-19 declaration under Section 564(b)(1) of the Act, 21 U.S.C. section 360bbb-3(b)(1), unless the authorization is terminated or revoked.     Resp Syncytial Virus by PCR NEGATIVE NEGATIVE Final    Comment: (NOTE) Fact Sheet for Patients: BloggerCourse.com  Fact Sheet for Healthcare  Providers: SeriousBroker.it  This test is not yet approved or cleared by the [place]  FDA and has been authorized for detection and/or diagnosis of SARS-CoV-2 by FDA under an Emergency Use Authorization (EUA). This EUA will remain in effect (meaning this test can be used) for the duration of the COVID-19 declaration under Section 564(b)(1) of the Act, 21 U.S.C. section 360bbb-3(b)(1), unless the authorization is terminated or revoked.  Performed at [facility], [address] [place], [place] [address]   Blood Culture (routine x 2)     Status: None   Collection Time: 09/02/23 12:17 PM   Specimen: BLOOD  Result Value Ref Range Status   Specimen Description BLOOD BLOOD RIGHT FOREARM  Final   Special Requests   Final    BOTTLES DRAWN AEROBIC AND ANAEROBIC Blood Culture adequate volume   Culture   Final    NO GROWTH 5 DAYS Performed at [facility], [address] [place], [place] [address]    Report Status 09/07/2023 FINAL  Final  MRSA Next Gen by PCR, Nasal     Status: None   Collection Time: 09/02/23  6:58 PM   Specimen: Nasal Mucosa; Nasal Swab  Result Value Ref Range Status   MRSA by PCR Next Gen NOT DETECTED NOT DETECTED Final    Comment: (NOTE) The GeneXpert MRSA Assay (FDA approved for NASAL specimens only), is one component of a comprehensive  MRSA colonization surveillance program. It is not intended to diagnose MRSA infection nor to guide or monitor treatment for MRSA infections. Test performance is not FDA approved in patients less than 6 years old. Performed at [facility], [address] [place], [place] [address]   Culture, blood (Routine X 2) w Reflex to ID Panel     Status: None   Collection Time: 09/03/23 11:33 PM   Specimen: BLOOD  Result Value Ref Range Status   Specimen Description BLOOD RIGHT ARM  Final   Special Requests   Final    BOTTLES DRAWN AEROBIC AND ANAEROBIC Blood Culture adequate volume   Culture   Final    NO GROWTH 5 DAYS Performed at [facility] [address] [place], [place] [address]    Report Status 09/08/2023 FINAL  Final  Culture, blood (Routine X 2) w Reflex to ID Panel     Status: None   Collection Time: 09/03/23 11:41 PM   Specimen: BLOOD  Result Value Ref Range Status   Specimen Description BLOOD LEFT ARM  Final   Special Requests   Final    BOTTLES DRAWN AEROBIC ONLY Blood Culture results may not be optimal due to an inadequate volume of blood received in culture bottles   Culture   Final    NO GROWTH 5 DAYS Performed at [facility], [address] [place], [place] [address]    Report Status 09/08/2023 FINAL  Final    Procedures and diagnostic studies:  ECHO TEE Result Date: 09/08/2023    TRANSESOPHOGEAL ECHO REPORT   Patient Name:   Shelby Conway Date of Exam: 09/08/2023 Medical Rec #:  [id number]        Height:       62.0 in Accession #:    [id number]       Weight:       240.3 lb Date of Birth:  [birth year]        BSA:          2.067 m Patient Age:    64  years         BP:           99/43 mmHg Patient Gender: F                HR:           95 bpm. Exam Location:  [facility] Procedure: Transesophageal Echo, Cardiac Doppler, Color Doppler and Saline            Contrast Bubble Study (Both Spectral and Color Flow Doppler were            utilized during  procedure). Indications:     Bacteremia R78.81  History:         Patient has prior history of Echocardiogram examinations, most                  recent 09/06/2023. CHF, Stroke; Risk Factors:Hypertension.  Sonographer:     Broadus Canes Referring Phys:  [id number] [facility] L CAREY Diagnosing Phys: Belva Boyden MD PROCEDURE: After discussion of the risks and benefits of a TEE, an informed consent was obtained from the patient. TEE procedure time was 30 minutes. The transesophogeal probe was passed without difficulty through the esophogus of the patient. Imaged were obtained with the patient in a left lateral decubitus position. Local oropharyngeal anesthetic was provided with viscous lidocaine and Cetacaine. Sedation performed by different physician. The patient was monitored while under deep sedation. Image quality was excellent. The patient's vital signs; including heart rate, blood pressure, and oxygen saturation; remained stable throughout the procedure. The patient developed no complications during the procedure.  IMPRESSIONS  1. No valve vegetation noted.  2. Left ventricular ejection fraction, by estimation, is 55 to 60%. The left ventricle has normal function. The left ventricle has no regional wall motion abnormalities. Left ventricular diastolic parameters are indeterminate.  3. Right ventricular systolic function is normal. The right ventricular size is normal.  4. Left atrial size was moderately dilated. No left atrial/left atrial appendage thrombus was detected.  5. The mitral valve is normal in structure. Mild calcification of the anterior leaflet. Mild mitral valve regurgitation. No evidence of mitral stenosis.  6. Tricuspid valve regurgitation is mild to moderate.  7. The aortic valve is normal in structure. There is mild calcification of the aortic valve. Aortic valve regurgitation is not visualized. Aortic valve sclerosis is present, with no evidence of aortic valve stenosis.  8. There is Moderate (Grade  III) atheroma plaque involving the aortic arch and descending aorta.  9. The inferior vena cava is normal in size with greater than 50% respiratory variability, suggesting right atrial pressure of 3 mmHg. 10. Agitated saline contrast bubble study was negative, with no evidence of any interatrial shunt. Conclusion(s)/Recommendation(s): Normal biventricular function without evidence of hemodynamically significant valvular heart disease. FINDINGS  Left Ventricle: Left ventricular ejection fraction, by estimation, is 55 to 60%. The left ventricle has normal function. The left ventricle has no regional wall motion abnormalities. The left ventricular internal cavity size was normal in size. There is  no left ventricular hypertrophy. Left ventricular diastolic parameters are indeterminate. Right Ventricle: The right ventricular size is normal. No increase in right ventricular wall thickness. Right ventricular systolic function is normal. Left Atrium: Left atrial size was moderately dilated. No left atrial/left atrial appendage thrombus was detected. Right Atrium: Right atrial size was normal in size. Pericardium: There is no evidence of pericardial effusion. Mitral Valve: The mitral valve is normal in structure. There is mild calcification of the mitral  valve leaflet(s). Mild mitral valve regurgitation. No evidence of mitral valve stenosis. There is no evidence of mitral valve vegetation. Tricuspid Valve: The tricuspid valve is normal in structure. Tricuspid valve regurgitation is mild to moderate. No evidence of tricuspid stenosis. There is no evidence of tricuspid valve vegetation. Aortic Valve: The aortic valve is normal in structure. There is mild calcification of the aortic valve. Aortic valve regurgitation is not visualized. Aortic valve sclerosis is present, with no evidence of aortic valve stenosis. There is no evidence of aortic valve vegetation. Pulmonic Valve: The pulmonic valve was normal in structure. Pulmonic  valve regurgitation is not visualized. No evidence of pulmonic stenosis. There is no evidence of pulmonic valve vegetation. Aorta: The aortic root is normal in size and structure. There is moderate (Grade III) atheroma plaque involving the aortic arch and descending aorta. Venous: The inferior vena cava is normal in size with greater than 50% respiratory variability, suggesting right atrial pressure of 3 mmHg. IAS/Shunts: No atrial level shunt detected by color flow Doppler. Agitated saline contrast was given intravenously to evaluate for intracardiac shunting. Agitated saline contrast bubble study was negative, with no evidence of any interatrial shunt. There  is no evidence of a patent foramen ovale. There is no evidence of an atrial septal defect. Additional Comments: 3D was performed not requiring image post processing on an independent workstation and was indeterminate. Belva Boyden MD Electronically signed by Belva Boyden MD Signature Date/Time: 09/08/2023/10:46:28 AM    Final    ECHOCARDIOGRAM COMPLETE Result Date: 09/06/2023    ECHOCARDIOGRAM REPORT   Patient Name:   Shelby Conway Date of Exam: 09/06/2023 Medical Rec #:  [id number]        Height:       62.0 in Accession #:    [id number]       Weight:       222.2 lb Date of Birth:  1956-01-28        BSA:          1.999 m Patient Age:    67 years         BP:           111/80 mmHg Patient Gender: F                HR:           98 bpm. Exam Location:  [facility] Procedure: 2D Echo, Cardiac Doppler and Color Doppler (Both Spectral and Color            Flow Doppler were utilized during procedure). Indications:     Bacteremia R78.81  History:         Patient has no prior history of Echocardiogram examinations.                  CHF, Stroke; Risk Factors:Hypertension.  Sonographer:     Broadus Canes Referring Phys:  [id number] Alica Inks Diagnosing Phys: Lida Reeks Alluri IMPRESSIONS  1. Technically difficult study.  2. Left ventricular ejection fraction, by  estimation, is 50 to 55%. The left ventricle has low normal function. The left ventricle has no regional wall motion abnormalities. There is mild left ventricular hypertrophy. Left ventricular diastolic parameters are indeterminate.  3. Right ventricular systolic function is normal. The right ventricular size is normal.  4. Left atrial size was mildly dilated.  5. Echogenic structure on mitral valve- can be degenerative change related to mitral annular calcification, cannot rule out vegetation. Consider TEE for further evaluation as clinically indicated.  Trivial mitral valve regurgitation. Moderate mitral annular calcification.  6. The aortic valve was not well visualized. Aortic valve regurgitation is not visualized. No aortic stenosis is present.  7. The inferior vena cava is normal in size with greater than 50% respiratory variability, suggesting right atrial pressure of 3 mmHg. FINDINGS  Left Ventricle: Left ventricular ejection fraction, by estimation, is 50 to 55%. The left ventricle has low normal function. The left ventricle has no regional wall motion abnormalities. The left ventricular internal cavity size was normal in size. There is mild left ventricular hypertrophy. Left ventricular diastolic parameters are indeterminate. Right Ventricle: The right ventricular size is normal. No increase in right ventricular wall thickness. Right ventricular systolic function is normal. Left Atrium: Left atrial size was mildly dilated. Right Atrium: Right atrial size was normal in size. Pericardium: There is no evidence of pericardial effusion. Mitral Valve: Echogenic structure on mitral valve- can be degenerative change related to mitral annular calcification, cannot rule out vegetation. Consider TEE for further evaluation as clinically indicated. There is mild thickening of the mitral valve leaflet(s). Moderate mitral annular calcification. Trivial mitral valve regurgitation. Tricuspid Valve: The tricuspid valve is  not well visualized. Tricuspid valve regurgitation is trivial. Aortic Valve: The aortic valve was not well visualized. Aortic valve regurgitation is not visualized. No aortic stenosis is present. Aortic valve mean gradient measures 7.0 mmHg. Aortic valve peak gradient measures 9.7 mmHg. Aortic valve area, by VTI measures 2.49 cm. Pulmonic Valve: The pulmonic valve was not well visualized. Pulmonic valve regurgitation is not visualized. Aorta: The aortic root is normal in size and structure. Venous: The inferior vena cava is normal in size with greater than 50% respiratory variability, suggesting right atrial pressure of 3 mmHg. IAS/Shunts: The interatrial septum was not well visualized.  LEFT VENTRICLE PLAX 2D LVIDd:         3.10 cm LVIDs:         2.30 cm LV PW:         0.90 cm LV IVS:        1.30 cm LVOT diam:     2.00 cm LV SV:         64 LV SV Index:   32 LVOT Area:     3.14 cm  RIGHT VENTRICLE RV Basal diam:  3.10 cm RV Mid diam:    2.50 cm RV S prime:     12.60 cm/s TAPSE (M-mode): 1.9 cm LEFT ATRIUM             Index        RIGHT ATRIUM           Index LA diam:        3.70 cm 1.85 cm/m   RA Area:     12.60 cm LA Vol (A2C):   58.6 ml 29.31 ml/m  RA Volume:   28.70 ml  14.36 ml/m LA Vol (A4C):   79.7 ml 39.87 ml/m LA Biplane Vol: 70.9 ml 35.46 ml/m  AORTIC VALVE AV Area (Vmax):    2.78 cm AV Area (Vmean):   2.52 cm AV Area (VTI):     2.49 cm AV Vmax:           156.00 cm/s AV Vmean:          120.000 cm/s AV VTI:            0.259 m AV Peak Grad:      9.7 mmHg AV Mean Grad:      7.0  mmHg LVOT Vmax:         138.00 cm/s LVOT Vmean:        96.400 cm/s LVOT VTI:          0.205 m LVOT/AV VTI ratio: 0.79  AORTA Ao Root diam: 2.20 cm MITRAL VALVE                TRICUSPID VALVE MV Area (PHT): 3.33 cm     TR Peak grad:   14.7 mmHg MV Decel Time: 228 msec     TR Vmax:        192.00 cm/s MV E velocity: 132.00 cm/s                             SHUNTS                             Systemic VTI:  0.20 m                              Systemic Diam: 2.00 cm Joetta Mustache Electronically signed by Joetta Mustache Signature Date/Time: 09/06/2023/5:01:06 PM    Final                LOS: 6 days   Druscilla Petsch  [facility]   Pager on [email or web address]. If 7PM-7AM, please contact night-coverage at [email or web address]     09/08/2023, 11:50 AM

## 2023-09-08 NOTE — Evaluation (Signed)
 Physical Therapy Evaluation Patient Details Name: Shelby Conway MRN: [id number] DOB: 1956-03-30 Today's Date: 09/08/2023  History of Present Illness  68 year old female with history of hypertension, history of stroke, morbid obesity, poor dentition, who presents ED via EMS from home for chief concerns of altered mental status. Admitted for hyperosmolar hyperglycemic state, new dx of diabetes mellitus.   Clinical Impression  Patient alert, agreeable to PT was up ambulating with tech to bathroom upon PT arrival. At baseline the pt is independent - modI for ADLs, family assists with IADLs (lives with family).  She was able to ambulate ~12ft with RW and CGA-supervision. No LOB , some generalized weakness complained of by pt, but steady. Able to perform pericare in standing but limited due to back pain, PT assist to ensure cleanliness. Pt up in recliner with needs in reach at end of session.  Overall the patient demonstrated deficits (see "PT Problem List") that impede the patient's functional abilities, safety, and mobility and would benefit from skilled PT intervention.           If plan is discharge home, recommend the following: Assistance with cooking/housework;Assist for transportation;Help with stairs or ramp for entrance   Can travel by private vehicle        Equipment Recommendations Rolling walker (2 wheels)  Recommendations for Other Services       Functional Status Assessment Patient has had a recent decline in their functional status and demonstrates the ability to make significant improvements in function in a reasonable and predictable amount of time.     Precautions / Restrictions Precautions Precautions: Fall Restrictions Weight Bearing Restrictions Per Provider Order: No      Mobility  Bed Mobility               General bed mobility comments: pt up with tech in room    Transfers Overall transfer level: Needs assistance Equipment used: Rolling walker  (2 wheels) Transfers: Sit to/from Stand Sit to Stand: Supervision                Ambulation/Gait Ambulation/Gait assistance: Supervision Gait Distance (Feet): 140 Feet Assistive device: Rolling walker (2 wheels)            Stairs            Wheelchair Mobility     Tilt Bed    Modified Rankin (Stroke Patients Only)       Balance Overall balance assessment: Needs assistance Sitting-balance support: Feet supported Sitting balance-Leahy Scale: Good     Standing balance support: Single extremity supported Standing balance-Leahy Scale: Good Standing balance comment: attempted pericare in standing, limited due to back pain                             Pertinent Vitals/Pain Pain Assessment Pain Assessment: 0-10 Pain Score: 8  Pain Location: low back Pain Descriptors / Indicators: Aching, Constant    Home Living Family/patient expects to be discharged to:: Private residence Living Arrangements: Children (son, DIL, granddtr (12yo)) Available Help at Discharge: Family;Available 24 hours/day Type of Home: Mobile home (rental) Home Access: Stairs to enter Entrance Stairs-Rails: Doctor, general practice of Steps: 3-4   Home Layout: One level Home Equipment: None      Prior Function Prior Level of Function : Independent/Modified Independent;History of Falls (last six months)             Mobility Comments: no AD, fell the day she was admitted  ADLs Comments: Indep with ADL, family assist for meals, cleaning; can drive but hasn't in a while, family can provide transportation     Extremity/Trunk Assessment   Upper Extremity Assessment Upper Extremity Assessment: Generalized weakness    Lower Extremity Assessment Lower Extremity Assessment: Generalized weakness       Communication   Communication Communication: No apparent difficulties    Cognition Arousal: Alert Behavior During Therapy: WFL for tasks  assessed/performed                             Following commands: Intact       Cueing Cueing Techniques: Verbal cues     General Comments      Exercises     Assessment/Plan    PT Assessment Patient needs continued PT services  PT Problem List Decreased strength;Decreased activity tolerance;Decreased balance;Decreased mobility       PT Treatment Interventions DME instruction;Balance training;Gait training;Neuromuscular re-education;Patient/family education;Stair training;Functional mobility training;Therapeutic activities;Therapeutic exercise    PT Goals (Current goals can be found in the Care Plan section)  Acute Rehab PT Goals Patient Stated Goal: to go home PT Goal Formulation: With patient Time For Goal Achievement: 09/22/23 Potential to Achieve Goals: Good    Frequency Min 1X/week     Co-evaluation               AM-PAC PT "6 Clicks" Mobility  Outcome Measure Help needed turning from your back to your side while in a flat bed without using bedrails?: None Help needed moving from lying on your back to sitting on the side of a flat bed without using bedrails?: None Help needed moving to and from a bed to a chair (including a wheelchair)?: None Help needed standing up from a chair using your arms (e.g., wheelchair or bedside chair)?: None Help needed to walk in hospital room?: None Help needed climbing 3-5 steps with a railing? : A Little 6 Click Score: 23    End of Session   Activity Tolerance: Patient tolerated treatment well Patient left: in chair;with call bell/phone within reach Nurse Communication: Mobility status PT Visit Diagnosis: Other abnormalities of gait and mobility (R26.89);Difficulty in walking, not elsewhere classified (R26.2);Muscle weakness (generalized) (M62.81)    Time: 1338-1400 PT Time Calculation (min) (ACUTE ONLY): 22 min   Charges:   PT Evaluation $PT Eval Low Complexity: 1 Low PT Treatments $Therapeutic  Activity: 8-22 mins PT General Charges $$ ACUTE PT VISIT: 1 Visit         Darien Eden PT, DPT 4:06 PM,09/08/23

## 2023-09-08 NOTE — Anesthesia Preprocedure Evaluation (Signed)
 Anesthesia Evaluation  Patient identified by MRN, date of birth, ID band Patient awake    Reviewed: Allergy & Precautions, H&P , NPO status , Patient's Chart, lab work & pertinent test results, reviewed documented beta blocker date and time   Airway Mallampati: II   Neck ROM: full    Dental  (+) Poor Dentition   Pulmonary neg shortness of breath, COPD, former smoker   Pulmonary exam normal        Cardiovascular Exercise Tolerance: Poor hypertension, On Medications +CHF  (-) Orthopnea and (-) PND Normal cardiovascular exam Rhythm:regular Rate:Normal     Neuro/Psych CVA  negative psych ROS   GI/Hepatic negative GI ROS, Neg liver ROS,,,  Endo/Other  diabetes    Renal/GU negative Renal ROS  negative genitourinary   Musculoskeletal   Abdominal   Peds  Hematology negative hematology ROS (+)   Anesthesia Other Findings Past Medical History: No date: Arthritis No date: CHF (congestive heart failure) (HCC) No date: Hypertension No date: Stroke [facility]) Past Surgical History: No date: CARPAL TUNNEL RELEASE No date: CESAREAN SECTION No date: CHOLECYSTECTOMY No date: REPLACEMENT TOTAL KNEE BMI    Body Mass Index: 43.95 kg/m     Reproductive/Obstetrics negative OB ROS                             Anesthesia Physical Anesthesia Plan  ASA: 4  Anesthesia Plan: General   Post-op Pain Management:    Induction:   PONV Risk Score and Plan:   Airway Management Planned:   Additional Equipment:   Intra-op Plan:   Post-operative Plan:   Informed Consent: I have reviewed the patients History and Physical, chart, labs and discussed the procedure including the risks, benefits and alternatives for the proposed anesthesia with the patient or authorized representative who has indicated his/her understanding and acceptance.     Dental Advisory Given  Plan Discussed with: CRNA  Anesthesia  Plan Comments:        Anesthesia Quick Evaluation

## 2023-09-08 NOTE — Anesthesia Procedure Notes (Signed)
 Procedure Name: General with mask airway Date/Time: 09/08/2023 7:39 AM  Performed by: Marisue Side, CRNAPre-anesthesia Checklist: Emergency Drugs available, Patient identified, Suction available, Patient being monitored and Timeout performed Patient Re-evaluated:Patient Re-evaluated prior to induction Oxygen Delivery Method: Simple face mask Comments: POM

## 2023-09-08 NOTE — Progress Notes (Signed)
 Transesophageal Echocardiogram :  Indication: Bacteremia, rule out endocarditis Requesting/ordering  physician: Ravisankar  Procedure: Benzocaine spray x2 and 2 mls x 2 of viscous lidocaine were given orally to provide local anesthesia to the oropharynx. The patient was positioned supine on the left side, bite block provided. The patient was moderately sedated with the doses of versed and fentanyl as detailed below.  Using digital technique an omniplane probe was advanced into the distal esophagus without incident.   Moderate sedation: 1. Sedation used: Per anesthesia service  See report in EPIC  for complete details: In brief, No valve vegetation noted  transgastric imaging revealed normal LV function with no RWMAs and no mural apical thrombus.  .  Estimated ejection fraction was 55%.  Right sided cardiac chambers were normal with no evidence of pulmonary hypertension.  Mild to moderate calcification of anterior mitral valve leaflet, mild MR Mild to moderate TR  Imaging of the septum showed no ASD or VSD Bubble study was negative for shunt 2D and color flow confirmed no PFO  The LA was well visualized in orthogonal views.  There was no spontaneous contrast and no thrombus in the LA and LA appendage   Moderately dilated left atrium  The descending thoracic aorta had no  mural aortic debris with no evidence of aneurysmal dilation or disection Moderate diffuse aortic atherosclerosis arch and descending aorta   Shelby Conway 09/08/2023 11:53 AM

## 2023-09-08 NOTE — Progress Notes (Signed)
 Date of Admission:  09/02/2023      ID: Shelby Conway is a 68 y.o. female Principal Problem:   Hyperosmolar hyperglycemic state (HHS) (HCC) Active Problems:   Sepsis due to cellulitis (HCC)   Leukocytosis   Altered mental status   Hyperosmolar hyponatremia   Chronic pain   At risk for polypharmacy   Vaginal candidiasis   Morbid obesity (HCC)   Essential hypertension   Bacteremia due to Enterococcus   Candidal intertrigo   Bacteremia    Subjective: Pt is going for CT   Medications:   vitamin C  500 mg Oral BID   furosemide  40 mg Intravenous Daily   heparin   5,000 Units Subcutaneous Q8H   insulin  aspart  0-15 Units Subcutaneous TID WC   insulin  aspart  0-5 Units Subcutaneous QHS   insulin  aspart  13 Units Subcutaneous TID WC   insulin  glargine-yfgn  35 Units Subcutaneous Daily   multivitamin with minerals  1 tablet Oral Daily   nicotine   14 mg Transdermal Daily   nystatin    Topical TID   polyethylene glycol  17 g Oral Daily   Ensure Max Protein  11 oz Oral BID   thiamine   100 mg Oral Daily    Objective: Vital signs in last 24 hours: Patient Vitals for the past 24 hrs:  BP Temp Temp src Pulse Resp SpO2 Height Weight  09/08/23 0922 (!) 139/59 98.5 F (36.9 C) Oral 79 17 100 % -- --  09/08/23 0859 (!) 88/51 -- -- 90 15 94 % -- --  09/08/23 0845 (!) 107/59 -- -- 89 16 97 % -- --  09/08/23 0835 -- -- -- 88 17 98 % -- --  09/08/23 0830 115/71 -- -- 96 20 97 % -- --  09/08/23 0820 (!) 99/43 -- -- -- (!) 22 99 % -- --  09/08/23 0730 (!) 103/59 -- -- 92 18 94 % -- --  09/08/23 0715 -- -- -- (!) 102 (!) 23 92 % -- --  09/08/23 0714 132/88 98.6 F (37 C) Oral 97 20 96 % 5\' 2"  (1.575 m) 109 kg  09/08/23 0514 122/65 (!) 97.5 F (36.4 C) Oral 88 18 97 % -- --  09/08/23 0513 -- -- -- -- -- -- -- 109 kg  09/07/23 2047 139/70 98.7 F (37.1 C) Oral (!) 105 18 96 % -- --    PHYSICAL EXAM:  General: Alert, cooperative, no distress, appears stated age. Poor dentition  multiple lower broken teeth  Lungs: Clear to auscultation bilaterally. No Wheezing or Rhonchi. No rales. Heart: Regular rate and rhythm, no murmur, rub or gallop. Abdomen: Soft, pannus Extremities: atraumatic, no cyanosis. No edema. No clubbing Skin: intertrigo pannus and under breast improving Lymph: Cervical, supraclavicular normal. Neurologic: Grossly non-focal  Lab Results    Latest Ref Rng & Units 09/08/2023    4:25 AM 09/07/2023    5:51 AM 09/06/2023    4:24 AM  CBC  WBC 4.0 - 10.5 K/uL 5.9  6.5  6.2   Hemoglobin 12.0 - 15.0 g/dL 16.1  09.6  04.5   Hematocrit 36.0 - 46.0 % 36.7  39.4  41.1   Platelets 150 - 400 K/uL 132  142  125        Latest Ref Rng & Units 09/08/2023    4:25 AM 09/07/2023    5:51 AM 09/06/2023    4:24 AM  CMP  Glucose 70 - 99 mg/dL 409  811  914  BUN 8 - 23 mg/dL 15  19  15    Creatinine 0.44 - 1.00 mg/dL 0.98  1.19  1.47   Sodium 135 - 145 mmol/L 132  129  133   Potassium 3.5 - 5.1 mmol/L 4.3  4.2  3.8   Chloride 98 - 111 mmol/L 96  94  95   CO2 22 - 32 mmol/L 28  26  29    Calcium 8.9 - 10.3 mg/dL 9.2  9.0  8.7       Microbiology: [facility] 5/1 11:45 AM enterococcus, Peptostreptococcus and actinomyces 5/ 1 blood culture from 12:17 PM no growth 09/03/23 BC NG Studies/Results: CT ABDOMEN PELVIS W CONTRAST Result Date: 09/08/2023 CLINICAL DATA:  Polymicrobial bacteremia including Enterococcus faecalis bacteremia EXAM: CT ABDOMEN AND PELVIS WITH CONTRAST TECHNIQUE: Multidetector CT imaging of the abdomen and pelvis was performed using the standard protocol following bolus administration of intravenous contrast. RADIATION DOSE REDUCTION: This exam was performed according to the departmental dose-optimization program which includes automated exposure control, adjustment of the mA and/or kV according to patient size and/or use of iterative reconstruction technique. CONTRAST:  OMNIPAQUE IOHEXOL 350 MG/ML SOLN COMPARISON:  None are available FINDINGS: Lower chest: No  acute abnormality. Hepatobiliary: Cholecystectomy. Hepatic steatosis. Nodular hepatic contour. No biliary dilation. Pancreas: Fatty atrophy.  No acute abnormality. Spleen: Unremarkable. Adrenals/Urinary Tract: Unremarkable adrenal glands. No urinary calculi or hydronephrosis. Unremarkable bladder. Stomach/Bowel: Normal caliber large and small bowel. No bowel wall thickening. Stomach and appendix are within normal limits. Enteric contrast is present within the stomach and small bowel. Vascular/Lymphatic: Aortic atherosclerosis. No enlarged abdominal or pelvic lymph nodes. Reproductive: Uterus and bilateral adnexa are unremarkable. Other: No free intraperitoneal fluid or air. Fat containing umbilical hernia containing prominent vessels and fat stranding. Body wall edema. Musculoskeletal: Superior endplate compression of L2 is presumed chronic but age indeterminate without recent comparison. Correlate for pain. Otherwise no acute fracture. IMPRESSION: 1. Fat containing umbilical hernia containing prominent vessels and fat stranding. Correlate for incarceration. 2. Hepatic steatosis. Nodular hepatic contour suggesting cirrhosis. 3. Superior endplate compression of L2 is presumed chronic but age indeterminate without recent comparison. Correlate for pain. 4. Body wall edema. 5. Aortic Atherosclerosis (ICD10-I70.0). Electronically Signed   By: Rozell Cornet M.D.   On: 09/08/2023 19:36   ECHO TEE Result Date: 09/08/2023    TRANSESOPHOGEAL ECHO REPORT   Patient Name:   Shelby Conway Date of Exam: 09/08/2023 Medical Rec #:  [id number]        Height:       62.0 in Accession #:    [id number]       Weight:       240.3 lb Date of Birth:  [birth year]        BSA:          2.067 m Patient Age:    67 years         BP:           99/43 mmHg Patient Gender: F                HR:           95 bpm. Exam Location:  [facility] Procedure: Transesophageal Echo, Cardiac Doppler, Color Doppler and Saline            Contrast Bubble Study (Both  Spectral and Color Flow Doppler were            utilized during procedure). Indications:     Bacteremia R78.81  History:  Patient has prior history of Echocardiogram examinations, most                  recent 09/06/2023. CHF, Stroke; Risk Factors:Hypertension.  Sonographer:     Broadus Canes Referring Phys:  [id number] [facility] Diagnosing Phys: Belva Boyden MD PROCEDURE: After discussion of the risks and benefits of a TEE, an informed consent was obtained from the patient. TEE procedure time was 30 minutes. The transesophogeal probe was passed without difficulty through the esophogus of the patient. Imaged were obtained with the patient in a left lateral decubitus position. Local oropharyngeal anesthetic was provided with viscous lidocaine and Cetacaine. Sedation performed by different physician. The patient was monitored while under deep sedation. Image quality was excellent. The patient's vital signs; including heart rate, blood pressure, and oxygen saturation; remained stable throughout the procedure. The patient developed no complications during the procedure.  IMPRESSIONS  1. No valve vegetation noted.  2. Left ventricular ejection fraction, by estimation, is 55 to 60%. The left ventricle has normal function. The left ventricle has no regional wall motion abnormalities. Left ventricular diastolic parameters are indeterminate.  3. Right ventricular systolic function is normal. The right ventricular size is normal.  4. Left atrial size was moderately dilated. No left atrial/left atrial appendage thrombus was detected.  5. The mitral valve is normal in structure. Mild calcification of the anterior leaflet. Mild mitral valve regurgitation. No evidence of mitral stenosis.  6. Tricuspid valve regurgitation is mild to moderate.  7. The aortic valve is normal in structure. There is mild calcification of the aortic valve. Aortic valve regurgitation is not visualized. Aortic valve sclerosis is present, with no  evidence of aortic valve stenosis.  8. There is Moderate (Grade III) atheroma plaque involving the aortic arch and descending aorta.  9. The inferior vena cava is normal in size with greater than 50% respiratory variability, suggesting right atrial pressure of 3 mmHg. 10. Agitated saline contrast bubble study was negative, with no evidence of any interatrial shunt. Conclusion(s)/Recommendation(s): Normal biventricular function without evidence of hemodynamically significant valvular heart disease. FINDINGS  Left Ventricle: Left ventricular ejection fraction, by estimation, is 55 to 60%. The left ventricle has normal function. The left ventricle has no regional wall motion abnormalities. The left ventricular internal cavity size was normal in size. There is  no left ventricular hypertrophy. Left ventricular diastolic parameters are indeterminate. Right Ventricle: The right ventricular size is normal. No increase in right ventricular wall thickness. Right ventricular systolic function is normal. Left Atrium: Left atrial size was moderately dilated. No left atrial/left atrial appendage thrombus was detected. Right Atrium: Right atrial size was normal in size. Pericardium: There is no evidence of pericardial effusion. Mitral Valve: The mitral valve is normal in structure. There is mild calcification of the mitral valve leaflet(s). Mild mitral valve regurgitation. No evidence of mitral valve stenosis. There is no evidence of mitral valve vegetation. Tricuspid Valve: The tricuspid valve is normal in structure. Tricuspid valve regurgitation is mild to moderate. No evidence of tricuspid stenosis. There is no evidence of tricuspid valve vegetation. Aortic Valve: The aortic valve is normal in structure. There is mild calcification of the aortic valve. Aortic valve regurgitation is not visualized. Aortic valve sclerosis is present, with no evidence of aortic valve stenosis. There is no evidence of aortic valve vegetation.  Pulmonic Valve: The pulmonic valve was normal in structure. Pulmonic valve regurgitation is not visualized. No evidence of pulmonic stenosis. There is no evidence of pulmonic  valve vegetation. Aorta: The aortic root is normal in size and structure. There is moderate (Grade III) atheroma plaque involving the aortic arch and descending aorta. Venous: The inferior vena cava is normal in size with greater than 50% respiratory variability, suggesting right atrial pressure of 3 mmHg. IAS/Shunts: No atrial level shunt detected by color flow Doppler. Agitated saline contrast was given intravenously to evaluate for intracardiac shunting. Agitated saline contrast bubble study was negative, with no evidence of any interatrial shunt. There  is no evidence of a patent foramen ovale. There is no evidence of an atrial septal defect. Additional Comments: 3D was performed not requiring image post processing on an independent workstation and was indeterminate. Timothy Gollan MD Electronically signed by Belva Boyden MD Signature Date/Time: 09/08/2023/10:46:28 AM    Final      Assessment/Plan: Hyperosmolar hyperglycemic presentation  new onset- resolved Management as per primary team Hyponatremia  Dehydration Encephalopathy from the above  resolved ? Polymicrobial bacteremia including Enterococcus bacteremia, Peptostreptococcus and actinomyces 1/2 culture-these are GI organism.  She has poor dentition and Peptostreptococcus and actinomyces s can be present in the dental plaques/mouth Enterococcus is not a common organism to be present in dental caries Cases of dental infection with Enterococcus has been reported Other source of the 3 organisms is GI tract.   TEE no vegetation  CT of the abdomen and pelvis with contrast no intestinal pathology Patient is currently on Unasyn  and ceftriaxone- DC ceftriaxone On discharge can go on Augmentin XR BID until 09/16/23  AKI- resolved   Severe intertriginous candida with  possible secondary cellulitis On  fluconazole  200mg   every day x-7 days- completed Rx   Discussed the management with the patient and hospitalist

## 2023-09-08 NOTE — Progress Notes (Signed)
*  PRELIMINARY RESULTS* Echocardiogram Echocardiogram Transesophageal has been performed.  Shelby Conway 09/08/2023, 8:21 AM

## 2023-09-09 ENCOUNTER — Telehealth (HOSPITAL_COMMUNITY): Payer: Self-pay | Admitting: Pharmacy Technician

## 2023-09-09 ENCOUNTER — Other Ambulatory Visit (HOSPITAL_COMMUNITY): Payer: Self-pay

## 2023-09-09 ENCOUNTER — Encounter: Payer: Self-pay | Admitting: Cardiovascular Disease

## 2023-09-09 DIAGNOSIS — E1100 Type 2 diabetes mellitus with hyperosmolarity without nonketotic hyperglycemic-hyperosmolar coma (NKHHC): Secondary | ICD-10-CM | POA: Diagnosis not present

## 2023-09-09 DIAGNOSIS — K7460 Unspecified cirrhosis of liver: Secondary | ICD-10-CM | POA: Diagnosis present

## 2023-09-09 LAB — GLUCOSE, CAPILLARY
Glucose-Capillary: 128 mg/dL — ABNORMAL HIGH (ref 70–99)
Glucose-Capillary: 170 mg/dL — ABNORMAL HIGH (ref 70–99)
Glucose-Capillary: 218 mg/dL — ABNORMAL HIGH (ref 70–99)
Glucose-Capillary: 277 mg/dL — ABNORMAL HIGH (ref 70–99)

## 2023-09-09 LAB — BASIC METABOLIC PANEL WITH GFR
Anion gap: 10 (ref 5–15)
BUN: 12 mg/dL (ref 8–23)
CO2: 30 mmol/L (ref 22–32)
Calcium: 9.5 mg/dL (ref 8.9–10.3)
Chloride: 96 mmol/L — ABNORMAL LOW (ref 98–111)
Creatinine, Ser: 0.58 mg/dL (ref 0.44–1.00)
GFR, Estimated: >60 mL/min (ref >60)
Glucose, Bld: 125 mg/dL — ABNORMAL HIGH (ref 70–99)
Potassium: 4.2 mmol/L (ref 3.5–5.1)
Sodium: 136 mmol/L (ref 135–145)

## 2023-09-09 MED ORDER — FUROSEMIDE 10 MG/ML IJ SOLN
40.0000 mg | Freq: Two times a day (BID) | INTRAMUSCULAR | Status: DC
  Administered 2023-09-09 – 2023-09-10 (×2): 40 mg via INTRAVENOUS
  Filled 2023-09-09 (×2): qty 4

## 2023-09-09 MED ORDER — AMOXICILLIN 500 MG PO CAPS
1000.0000 mg | ORAL_CAPSULE | Freq: Three times a day (TID) | ORAL | Status: DC
  Filled 2023-09-09 (×2): qty 2

## 2023-09-09 MED ORDER — AMOXICILLIN 500 MG PO CAPS
1000.0000 mg | ORAL_CAPSULE | Freq: Three times a day (TID) | ORAL | Status: DC
  Administered 2023-09-09 – 2023-09-10 (×2): 1000 mg via ORAL
  Filled 2023-09-09 (×3): qty 2

## 2023-09-09 NOTE — Progress Notes (Addendum)
 Progress Note    Shelby Conway  BJY:[id number] DOB: 08/28/1955  DOA: 09/02/2023 PCP: [facility]      Brief Narrative:    Medical records reviewed and are as summarized below:  Shelby Conway is a 68 y.o. female with medical history significant for hypertension, hyperlipidemia, obesity, stroke in 2005, CHF, who presented to the hospital because of altered mental status.   Vitals in the ED showed T of 98, respiration rate of 14, heart rate of 113, blood pressure 96/78, improved to 125/65, SpO2 of 93% on 3 L nasal cannula.   Serum sodium is 122, potassium 4.4, chloride 85, bicarb 21, BUN of 26, serum creatinine 1.30, EGFR 45, nonfasting blood glucose 594, WBC 13.7, hemoglobin of 15.8, platelets of 229.   Anion gap was elevated at 16.  Lactic acid was 5.6.  UA was negative for ketones, leukocytes, nitrates.       Assessment/Plan:   Principal Problem:   Hyperosmolar hyperglycemic state (HHS) (HCC) Active Problems:   Sepsis due to cellulitis (HCC)   Altered mental status   Chronic pain   Essential hypertension   Hyperosmolar hyponatremia   Leukocytosis   Vaginal candidiasis   Morbid obesity (HCC)   At risk for polypharmacy   Bacteremia due to Enterococcus   Candidal intertrigo   Bacteremia   Liver cirrhosis (HCC)   Nutrition Problem: Inadequate oral intake Etiology: acute illness  Signs/Symptoms: per patient/family report   Body mass index is 44.48 kg/m.  (Morbid obesity)    Secondary to severe sepsis secondary to polymicrobial bacteremia: Blood culture showed Enterococcus faecalis, Peptostreptococcus assacchorolyticus and actinomyces species.   No evidence of vegetation on TEE. Peptostreptococcus and actinomyces 1/2 culture-these are GI organism.  She has poor dentition and Peptostreptococcus and actinomyces s can be present in the dental plaques/mouth  CT abdomen pelvis did not show any findings that would account for  bacteremia. Continue IV Unasyn .   ID specialist recommended Augmentin twice daily until 09/16/2023. Her insurance will cover amoxicillin.  Discussed with Dr. Francee Inch, ID specialist, via secure chat.  She is okay with amoxicillin 1 g 3 times daily. No growth on repeat blood cultures.   Type II DM with severe hyperglycemia, hyperosmolar hyperglycemic state: Continue Lantus  and NovoLog . Hemoglobin A1c 15.5 She has been evaluated by the diabetic coordinator.   Severe intertriginous Candida with possible secondary cellulitis: Completed 7 days of fluconazole  on 09/08/2023   Fluid overload: Patient reports history of CHF and used to take Lasix in the past.  Notably, patient had IV fluids initially for sepsis and HHS. Continue IV Lasix.  Suspected acute on chronic diastolic CHF. 2D echo and TEE showed preserved EF. BNP 185.8.   Liver cirrhosis: Unknown if liver cirrhosis is contributing to fluid overload.   CT abdomen pelvis on 09/08/2023 showed hepatic steatosis and findings suggestive of liver cirrhosis.  Of note, chart review showed patient was found to have hepatic steatosis on MRI abdomen done in January 2015. Recommended outpatient follow-up with gastroenterologist for further management.     AKI: Resolved Hyponatremia: Improved   Acute metabolic encephalopathy: Resolved   Plan of care was discussed with Donavon Fudge, son, over the phone in patient's room.  All his questions were answered.   Diet Order             Diet heart healthy/carb modified Room service appropriate? Yes; Fluid consistency: Thin  Diet effective now  Consultants: ID specialist  Procedures: TEE on 09/08/2023    Medications:    vitamin C  500 mg Oral BID   furosemide  40 mg Intravenous BID   heparin   5,000 Units Subcutaneous Q8H   insulin  aspart  0-15 Units Subcutaneous TID WC   insulin  aspart  0-5 Units Subcutaneous QHS   insulin  aspart  13 Units Subcutaneous TID  WC   insulin  glargine-yfgn  35 Units Subcutaneous Daily   multivitamin with minerals  1 tablet Oral Daily   nicotine   14 mg Transdermal Daily   nystatin    Topical TID   polyethylene glycol  17 g Oral Daily   Ensure Max Protein  11 oz Oral BID   Continuous Infusions:  ampicillin -sulbactam (UNASYN ) IV 3 g (09/09/23 0606)     Anti-infectives (From admission, onward)    Start     Dose/Rate Route Frequency Ordered Stop   09/07/23 2200  cefTRIAXone (ROCEPHIN) 2 g in sodium chloride  0.9 % 100 mL IVPB  Status:  Discontinued        2 g 200 mL/hr over 30 Minutes Intravenous Every 12 hours 09/07/23 1536 09/08/23 0921   09/05/23 1200  fluconazole  (DIFLUCAN ) tablet 150 mg  Status:  Discontinued        150 mg Oral every 72 hours 09/02/23 1501 09/03/23 1426   09/03/23 1600  fluconazole  (DIFLUCAN ) tablet 200 mg        200 mg Oral Daily 09/03/23 1456 09/08/23 0943   09/03/23 1000  Ampicillin -Sulbactam (UNASYN ) 3 g in sodium chloride  0.9 % 100 mL IVPB        3 g 200 mL/hr over 30 Minutes Intravenous Every 6 hours 09/03/23 0824     09/03/23 0845  ampicillin  (OMNIPEN) 2 g in sodium chloride  0.9 % 100 mL IVPB  Status:  Discontinued        2 g 300 mL/hr over 20 Minutes Intravenous Every 4 hours 09/03/23 0757 09/03/23 0824   09/03/23 0000  ceFEPIme  (MAXIPIME ) 2 g in sodium chloride  0.9 % 100 mL IVPB  Status:  Discontinued        2 g 200 mL/hr over 30 Minutes Intravenous Every 12 hours 09/02/23 1505 09/03/23 0757   09/02/23 1600  vancomycin  (VANCOCIN ) IVPB 1000 mg/200 mL premix        1,000 mg 200 mL/hr over 60 Minutes Intravenous  Once 09/02/23 1509 09/02/23 1710   09/02/23 1514  vancomycin  variable dose per unstable renal function (pharmacist dosing)  Status:  Discontinued         Does not apply See admin instructions 09/02/23 1514 09/03/23 0757   09/02/23 1200  ceFEPIme  (MAXIPIME ) 2 g in sodium chloride  0.9 % 100 mL IVPB        2 g 200 mL/hr over 30 Minutes Intravenous  Once 09/02/23 1150 09/02/23  1300   09/02/23 1200  metroNIDAZOLE  (FLAGYL ) IVPB 500 mg        500 mg 100 mL/hr over 60 Minutes Intravenous  Once 09/02/23 1150 09/02/23 1421   09/02/23 1200  vancomycin  (VANCOCIN ) IVPB 1000 mg/200 mL premix        1,000 mg 200 mL/hr over 60 Minutes Intravenous  Once 09/02/23 1150 09/02/23 1600   09/02/23 1200  fluconazole  (DIFLUCAN ) tablet 150 mg        150 mg Oral  Once 09/02/23 1150 09/02/23 1230              Family Communication/Anticipated D/C date and plan/Code Status   DVT prophylaxis:  heparin  injection 5,000 Units Start: 09/02/23 1415 SCDs Start: 09/02/23 1408     Code Status: Full Code  Family Communication: Plan discussed with Donavon Fudge, son, over the phone Disposition Plan: Plan to discharge home   Status is: Inpatient Remains inpatient appropriate because: Bacteremia       Subjective:   Interval events noted.  No new complaints.  She still has swelling in her legs.  No shortness of breath or chest pain.  Objective:    Vitals:   09/08/23 2052 09/09/23 0402 09/09/23 0425 09/09/23 0736  BP: (!) 146/92 139/79  129/73  Pulse: 99 89  80  Resp: 18 18  20   Temp: 97.7 F (36.5 C) 98.1 F (36.7 C)  97.8 F (36.6 C)  TempSrc: Oral Oral    SpO2: 100% 98%  99%  Weight:   110.3 kg   Height:       No data found.   Intake/Output Summary (Last 24 hours) at 09/09/2023 1346 Last data filed at 09/09/2023 1100 Gross per 24 hour  Intake 240 ml  Output 4950 ml  Net -4710 ml   Filed Weights   09/08/23 0513 09/08/23 0714 09/09/23 0425  Weight: 109 kg 109 kg 110.3 kg    Exam:  GEN: NAD SKIN: Warm and dry EYES: No pallor or icterus ENT: MMM CV: RRR PULM: CTA B ABD: soft, obese/distended, NT, +BS CNS: AAO x 3, non focal EXT: Bilateral leg edema      Data Reviewed:   I have personally reviewed following labs and imaging studies:  Labs: Labs show the following:   Basic Metabolic Panel: Recent Labs  Lab 09/03/23 0220 09/03/23 0824  09/04/23 0319 09/05/23 0309 09/06/23 0424 09/07/23 0551 09/08/23 0425 09/09/23 0545  NA 127*   < > 129* 129* 133* 129* 132* 136  K 3.3*   < > 3.8 4.1 3.8 4.2 4.3 4.2  CL 93*   < > 96* 94* 95* 94* 96* 96*  CO2 28   < > 25 26 29 26 28 30   GLUCOSE 164*   < > 244* 277* 195* 235* 153* 125*  BUN 16   < > 11 15 15 19 15 12   CREATININE 0.70   < > 0.56 0.59 0.57 0.63 0.71 0.58  CALCIUM 8.8*   < > 8.2* 8.2* 8.7* 9.0 9.2 9.5  MG 2.6*  --  1.8 1.7 1.6*  --  1.6*  --   PHOS 2.0*  --  2.1* 2.7 2.4*  --  3.7  --    < > = values in this interval not displayed.   GFR Estimated Creatinine Clearance: 78.8 mL/min (by C-G formula based on SCr of 0.58 mg/dL). Liver Function Tests: No results for input(s): "AST", "ALT", "ALKPHOS", "BILITOT", "PROT", "ALBUMIN" in the last 168 hours.  No results for input(s): "LIPASE", "AMYLASE" in the last 168 hours. No results for input(s): "AMMONIA" in the last 168 hours. Coagulation profile Recent Labs  Lab 09/02/23 1353  INR 1.3*    CBC: Recent Labs  Lab 09/04/23 0319 09/05/23 0309 09/06/23 0424 09/07/23 0551 09/08/23 0425  WBC 8.1 7.0 6.2 6.5 5.9  HGB 13.2 12.4 13.7 13.1 12.4  HCT 38.9 36.7 41.1 39.4 36.7  MCV 89.0 89.1 90.3 90.0 91.3  PLT 111* 117* 125* 142* 132*   Cardiac Enzymes: No results for input(s): "CKTOTAL", "CKMB", "CKMBINDEX", "TROPONINI" in the last 168 hours. BNP (last 3 results) No results for input(s): "PROBNP" in the last 8760 hours. CBG: Recent Labs  Lab 09/08/23 1156 09/08/23 1651 09/08/23 2056 09/09/23 0737 09/09/23 1137  GLUCAP 134* 131* 136* 128* 170*   D-Dimer: No results for input(s): "DDIMER" in the last 72 hours. Hgb A1c: No results for input(s): "HGBA1C" in the last 72 hours. Lipid Profile: No results for input(s): "CHOL", "HDL", "LDLCALC", "TRIG", "CHOLHDL", "LDLDIRECT" in the last 72 hours. Thyroid function studies: No results for input(s): "TSH", "T4TOTAL", "T3FREE", "THYROIDAB" in the last 72  hours.  Invalid input(s): "FREET3" Anemia work up: No results for input(s): "VITAMINB12", "FOLATE", "FERRITIN", "TIBC", "IRON", "RETICCTPCT" in the last 72 hours. Sepsis Labs: Recent Labs  Lab 09/02/23 1353 09/03/23 0220 09/05/23 0309 09/06/23 0424 09/07/23 0551 09/08/23 0425  WBC  --    < > 7.0 6.2 6.5 5.9  LATICACIDVEN 3.4*  --   --   --   --   --    < > = values in this interval not displayed.    Microbiology Recent Results (from the past 240 hours)  Blood Culture (routine x 2)     Status: Abnormal   Collection Time: 09/02/23 11:45 AM   Specimen: BLOOD  Result Value Ref Range Status   Specimen Description   Final    BLOOD BLOOD LEFT HAND Performed at [facility], [address], [place], [place] [address]    Special Requests   Final    BOTTLES DRAWN AEROBIC AND ANAEROBIC Blood Culture results may not be optimal due to an inadequate volume of blood received in culture bottles Performed at [facility], [address] [place], [place] [address]    Culture  Setup Time   Final    GRAM POSITIVE COCCI AEROBIC BOTTLE ONLY CRITICAL RESULT CALLED TO, READ BACK BY AND VERIFIED WITH: NATHAN BELEUE @0258  ON 09/03/23 SKL ANAEROBIC BOTTLE ONLY GRAM POSITIVE RODS CRITICAL RESULT CALLED TO, READ BACK BY AND VERIFIED WITH: TREY GREENWOOD AT 1002 09/04/23.PMF    Culture (A)  Final    ENTEROCOCCUS FAECALIS PEPTOSTREPTOCOCCUS ASACCHAROLYTICUS ACTINOMYCES SPECIES Standardized susceptibility testing for this organism is not available. Performed at [facility], [address] [place], [place] [address]    Report Status 09/07/2023 FINAL  Final   Organism ID, Bacteria ENTEROCOCCUS FAECALIS  Final      Susceptibility   Enterococcus faecalis - MIC*    AMPICILLIN  <=2 SENSITIVE Sensitive     VANCOMYCIN  2 SENSITIVE Sensitive     GENTAMICIN SYNERGY SENSITIVE Sensitive     * ENTEROCOCCUS FAECALIS  Blood Culture ID Panel (Reflexed)     Status: Abnormal    Collection Time: 09/02/23 11:45 AM  Result Value Ref Range Status   Enterococcus faecalis DETECTED (A) NOT DETECTED Final    Comment: CRITICAL RESULT CALLED TO, READ BACK BY AND VERIFIED WITH: NATHAN BELEUE @0258  ON 09/03/23 SKL    Enterococcus Faecium NOT DETECTED NOT DETECTED Final   Listeria monocytogenes NOT DETECTED NOT DETECTED Final   Staphylococcus species NOT DETECTED NOT DETECTED Final   Staphylococcus aureus (BCID) NOT DETECTED NOT DETECTED Final   Staphylococcus epidermidis NOT DETECTED NOT DETECTED Final   Staphylococcus lugdunensis NOT DETECTED NOT DETECTED Final   Streptococcus species NOT DETECTED NOT DETECTED Final   Streptococcus agalactiae NOT DETECTED NOT DETECTED Final   Streptococcus pneumoniae NOT DETECTED NOT DETECTED Final   Streptococcus pyogenes NOT DETECTED NOT DETECTED Final   A.calcoaceticus-baumannii NOT DETECTED NOT DETECTED Final   Bacteroides fragilis NOT DETECTED NOT DETECTED Final   Enterobacterales NOT DETECTED NOT DETECTED Final   Enterobacter  cloacae complex NOT DETECTED NOT DETECTED Final   Escherichia coli NOT DETECTED NOT DETECTED Final   Klebsiella aerogenes NOT DETECTED NOT DETECTED Final   Klebsiella oxytoca NOT DETECTED NOT DETECTED Final   Klebsiella pneumoniae NOT DETECTED NOT DETECTED Final   Proteus species NOT DETECTED NOT DETECTED Final   Salmonella species NOT DETECTED NOT DETECTED Final   Serratia marcescens NOT DETECTED NOT DETECTED Final   Haemophilus influenzae NOT DETECTED NOT DETECTED Final   Neisseria meningitidis NOT DETECTED NOT DETECTED Final   Pseudomonas aeruginosa NOT DETECTED NOT DETECTED Final   Stenotrophomonas maltophilia NOT DETECTED NOT DETECTED Final   Candida albicans NOT DETECTED NOT DETECTED Final   Candida auris NOT DETECTED NOT DETECTED Final   Candida glabrata NOT DETECTED NOT DETECTED Final   Candida krusei NOT DETECTED NOT DETECTED Final   Candida parapsilosis NOT DETECTED NOT DETECTED Final   Candida  tropicalis NOT DETECTED NOT DETECTED Final   Cryptococcus neoformans/gattii NOT DETECTED NOT DETECTED Final   Vancomycin  resistance NOT DETECTED NOT DETECTED Final    Comment: Performed at [facility], [address] [place], [place] [address]  Resp panel by RT-PCR (RSV, Flu A&B, Covid) Anterior Nasal Swab     Status: None   Collection Time: 09/02/23 12:17 PM   Specimen: Anterior Nasal Swab  Result Value Ref Range Status   SARS Coronavirus 2 by RT PCR NEGATIVE NEGATIVE Final    Comment: (NOTE) SARS-CoV-2 target nucleic acids are NOT DETECTED.  The SARS-CoV-2 RNA is generally detectable in upper respiratory specimens during the acute phase of infection. The lowest concentration of SARS-CoV-2 viral copies this assay can detect is 138 copies/mL. A negative result does not preclude SARS-Cov-2 infection and should not be used as the sole basis for treatment or other patient management decisions. A negative result may occur with  improper specimen collection/handling, submission of specimen other than nasopharyngeal swab, presence of viral mutation(s) within the areas targeted by this assay, and inadequate number of viral copies(<138 copies/mL). A negative result must be combined with clinical observations, patient history, and epidemiological information. The expected result is Negative.  Fact Sheet for Patients:  BloggerCourse.com  Fact Sheet for Healthcare Providers:  SeriousBroker.it  This test is no t yet approved or cleared by the [place]  FDA and  has been authorized for detection and/or diagnosis of SARS-CoV-2 by FDA under an Emergency Use Authorization (EUA). This EUA will remain  in effect (meaning this test can be used) for the duration of the COVID-19 declaration under Section 564(b)(1) of the Act, 21 U.S.C.section 360bbb-3(b)(1), unless the authorization is terminated  or revoked sooner.       Influenza  A by PCR NEGATIVE NEGATIVE Final   Influenza B by PCR NEGATIVE NEGATIVE Final    Comment: (NOTE) The Xpert Xpress SARS-CoV-2/FLU/RSV plus assay is intended as an aid in the diagnosis of influenza from Nasopharyngeal swab specimens and should not be used as a sole basis for treatment. Nasal washings and aspirates are unacceptable for Xpert Xpress SARS-CoV-2/FLU/RSV testing.  Fact Sheet for Patients: BloggerCourse.com  Fact Sheet for Healthcare Providers: SeriousBroker.it  This test is not yet approved or cleared by the [place]  FDA and has been authorized for detection and/or diagnosis of SARS-CoV-2 by FDA under an Emergency Use Authorization (EUA). This EUA will remain in effect (meaning this test can be used) for the duration of the COVID-19 declaration under Section 564(b)(1) of the Act, 21 U.S.C. section 360bbb-3(b)(1), unless the authorization is terminated  or revoked.     Resp Syncytial Virus by PCR NEGATIVE NEGATIVE Final    Comment: (NOTE) Fact Sheet for Patients: BloggerCourse.com  Fact Sheet for Healthcare Providers: SeriousBroker.it  This test is not yet approved or cleared by the [place]  FDA and has been authorized for detection and/or diagnosis of SARS-CoV-2 by FDA under an Emergency Use Authorization (EUA). This EUA will remain in effect (meaning this test can be used) for the duration of the COVID-19 declaration under Section 564(b)(1) of the Act, 21 U.S.C. section 360bbb-3(b)(1), unless the authorization is terminated or revoked.  Performed at [facility], [address] [place], [place] [address]   Blood Culture (routine x 2)     Status: None   Collection Time: 09/02/23 12:17 PM   Specimen: BLOOD  Result Value Ref Range Status   Specimen Description BLOOD BLOOD RIGHT FOREARM  Final   Special Requests   Final    BOTTLES DRAWN AEROBIC  AND ANAEROBIC Blood Culture adequate volume   Culture   Final    NO GROWTH 5 DAYS Performed at [facility], [address] [place], [place] [address]    Report Status 09/07/2023 FINAL  Final  MRSA Next Gen by PCR, Nasal     Status: None   Collection Time: 09/02/23  6:58 PM   Specimen: Nasal Mucosa; Nasal Swab  Result Value Ref Range Status   MRSA by PCR Next Gen NOT DETECTED NOT DETECTED Final    Comment: (NOTE) The GeneXpert MRSA Assay (FDA approved for NASAL specimens only), is one component of a comprehensive MRSA colonization surveillance program. It is not intended to diagnose MRSA infection nor to guide or monitor treatment for MRSA infections. Test performance is not FDA approved in patients less than 47 years old. Performed at [facility], [address] [place], [place] [address]   Culture, blood (Routine X 2) w Reflex to ID Panel     Status: None   Collection Time: 09/03/23 11:33 PM   Specimen: BLOOD  Result Value Ref Range Status   Specimen Description BLOOD RIGHT ARM  Final   Special Requests   Final    BOTTLES DRAWN AEROBIC AND ANAEROBIC Blood Culture adequate volume   Culture   Final    NO GROWTH 5 DAYS Performed at [facility], [address] [place], [place] [address]    Report Status 09/08/2023 FINAL  Final  Culture, blood (Routine X 2) w Reflex to ID Panel     Status: None   Collection Time: 09/03/23 11:41 PM   Specimen: BLOOD  Result Value Ref Range Status   Specimen Description BLOOD LEFT ARM  Final   Special Requests   Final    BOTTLES DRAWN AEROBIC ONLY Blood Culture results may not be optimal due to an inadequate volume of blood received in culture bottles   Culture   Final    NO GROWTH 5 DAYS Performed at [facility], [address] [place], [place] [address]    Report Status 09/08/2023 FINAL  Final    Procedures and diagnostic studies:  CT ABDOMEN PELVIS W CONTRAST Result Date: 09/08/2023 CLINICAL  DATA:  Polymicrobial bacteremia including Enterococcus faecalis bacteremia EXAM: CT ABDOMEN AND PELVIS WITH CONTRAST TECHNIQUE: Multidetector CT imaging of the abdomen and pelvis was performed using the standard protocol following bolus administration of intravenous contrast. RADIATION DOSE REDUCTION: This exam was performed according to the departmental dose-optimization program which includes automated exposure control, adjustment of the  mA and/or kV according to patient size and/or use of iterative reconstruction technique. CONTRAST:  OMNIPAQUE IOHEXOL 350 MG/ML SOLN COMPARISON:  None are available FINDINGS: Lower chest: No acute abnormality. Hepatobiliary: Cholecystectomy. Hepatic steatosis. Nodular hepatic contour. No biliary dilation. Pancreas: Fatty atrophy.  No acute abnormality. Spleen: Unremarkable. Adrenals/Urinary Tract: Unremarkable adrenal glands. No urinary calculi or hydronephrosis. Unremarkable bladder. Stomach/Bowel: Normal caliber large and small bowel. No bowel wall thickening. Stomach and appendix are within normal limits. Enteric contrast is present within the stomach and small bowel. Vascular/Lymphatic: Aortic atherosclerosis. No enlarged abdominal or pelvic lymph nodes. Reproductive: Uterus and bilateral adnexa are unremarkable. Other: No free intraperitoneal fluid or air. Fat containing umbilical hernia containing prominent vessels and fat stranding. Body wall edema. Musculoskeletal: Superior endplate compression of L2 is presumed chronic but age indeterminate without recent comparison. Correlate for pain. Otherwise no acute fracture. IMPRESSION: 1. Fat containing umbilical hernia containing prominent vessels and fat stranding. Correlate for incarceration. 2. Hepatic steatosis. Nodular hepatic contour suggesting cirrhosis. 3. Superior endplate compression of L2 is presumed chronic but age indeterminate without recent comparison. Correlate for pain. 4. Body wall edema. 5. Aortic  Atherosclerosis (ICD10-I70.0). Electronically Signed   By: Rozell Cornet M.D.   On: 09/08/2023 19:36   ECHO TEE Result Date: 09/08/2023    TRANSESOPHOGEAL ECHO REPORT   Patient Name:   Sahory A Avitia Date of Exam: 09/08/2023 Medical Rec #:  [id number]        Height:       62.0 in Accession #:    [id number]       Weight:       240.3 lb Date of Birth:  07-12-1955        BSA:          2.067 m Patient Age:    67 years         BP:           99/43 mmHg Patient Gender: F                HR:           95 bpm. Exam Location:  [facility] Procedure: Transesophageal Echo, Cardiac Doppler, Color Doppler and Saline            Contrast Bubble Study (Both Spectral and Color Flow Doppler were            utilized during procedure). Indications:     Bacteremia R78.81  History:         Patient has prior history of Echocardiogram examinations, most                  recent 09/06/2023. CHF, Stroke; Risk Factors:Hypertension.  Sonographer:     Broadus Canes Referring Phys:  [id number] [facility] L CAREY Diagnosing Phys: Belva Boyden MD PROCEDURE: After discussion of the risks and benefits of a TEE, an informed consent was obtained from the patient. TEE procedure time was 30 minutes. The transesophogeal probe was passed without difficulty through the esophogus of the patient. Imaged were obtained with the patient in a left lateral decubitus position. Local oropharyngeal anesthetic was provided with viscous lidocaine and Cetacaine. Sedation performed by different physician. The patient was monitored while under deep sedation. Image quality was excellent. The patient's vital signs; including heart rate, blood pressure, and oxygen saturation; remained stable throughout the procedure. The patient developed no complications during the procedure.  IMPRESSIONS  1. No valve vegetation noted.  2. Left ventricular ejection fraction, by estimation, is  55 to 60%. The left ventricle has normal function. The left ventricle has no regional wall motion abnormalities.  Left ventricular diastolic parameters are indeterminate.  3. Right ventricular systolic function is normal. The right ventricular size is normal.  4. Left atrial size was moderately dilated. No left atrial/left atrial appendage thrombus was detected.  5. The mitral valve is normal in structure. Mild calcification of the anterior leaflet. Mild mitral valve regurgitation. No evidence of mitral stenosis.  6. Tricuspid valve regurgitation is mild to moderate.  7. The aortic valve is normal in structure. There is mild calcification of the aortic valve. Aortic valve regurgitation is not visualized. Aortic valve sclerosis is present, with no evidence of aortic valve stenosis.  8. There is Moderate (Grade III) atheroma plaque involving the aortic arch and descending aorta.  9. The inferior vena cava is normal in size with greater than 50% respiratory variability, suggesting right atrial pressure of 3 mmHg. 10. Agitated saline contrast bubble study was negative, with no evidence of any interatrial shunt. Conclusion(s)/Recommendation(s): Normal biventricular function without evidence of hemodynamically significant valvular heart disease. FINDINGS  Left Ventricle: Left ventricular ejection fraction, by estimation, is 55 to 60%. The left ventricle has normal function. The left ventricle has no regional wall motion abnormalities. The left ventricular internal cavity size was normal in size. There is  no left ventricular hypertrophy. Left ventricular diastolic parameters are indeterminate. Right Ventricle: The right ventricular size is normal. No increase in right ventricular wall thickness. Right ventricular systolic function is normal. Left Atrium: Left atrial size was moderately dilated. No left atrial/left atrial appendage thrombus was detected. Right Atrium: Right atrial size was normal in size. Pericardium: There is no evidence of pericardial effusion. Mitral Valve: The mitral valve is normal in structure. There is mild  calcification of the mitral valve leaflet(s). Mild mitral valve regurgitation. No evidence of mitral valve stenosis. There is no evidence of mitral valve vegetation. Tricuspid Valve: The tricuspid valve is normal in structure. Tricuspid valve regurgitation is mild to moderate. No evidence of tricuspid stenosis. There is no evidence of tricuspid valve vegetation. Aortic Valve: The aortic valve is normal in structure. There is mild calcification of the aortic valve. Aortic valve regurgitation is not visualized. Aortic valve sclerosis is present, with no evidence of aortic valve stenosis. There is no evidence of aortic valve vegetation. Pulmonic Valve: The pulmonic valve was normal in structure. Pulmonic valve regurgitation is not visualized. No evidence of pulmonic stenosis. There is no evidence of pulmonic valve vegetation. Aorta: The aortic root is normal in size and structure. There is moderate (Grade III) atheroma plaque involving the aortic arch and descending aorta. Venous: The inferior vena cava is normal in size with greater than 50% respiratory variability, suggesting right atrial pressure of 3 mmHg. IAS/Shunts: No atrial level shunt detected by color flow Doppler. Agitated saline contrast was given intravenously to evaluate for intracardiac shunting. Agitated saline contrast bubble study was negative, with no evidence of any interatrial shunt. There  is no evidence of a patent foramen ovale. There is no evidence of an atrial septal defect. Additional Comments: 3D was performed not requiring image post processing on an independent workstation and was indeterminate. Belva Boyden MD Electronically signed by Belva Boyden MD Signature Date/Time: 09/08/2023/10:46:28 AM    Final                LOS: 7 days   Sheril Dines  [facility]   Pager on [email or web address]. If 7PM-7AM, please contact  night-coverage at [email or web address]     09/09/2023, 1:46 PM

## 2023-09-09 NOTE — Plan of Care (Signed)
  Problem: Education: Goal: Ability to describe self-care measures that may prevent or decrease complications (Diabetes Survival Skills Education) will improve Outcome: Progressing Goal: Individualized Educational Video(s) Outcome: Progressing   Problem: Coping: Goal: Ability to adjust to condition or change in health will improve Outcome: Progressing   Problem: Metabolic: Goal: Ability to maintain appropriate glucose levels will improve Outcome: Progressing   Problem: Skin Integrity: Goal: Risk for impaired skin integrity will decrease Outcome: Progressing   Problem: Cardiac: Goal: Ability to maintain an adequate cardiac output will improve Outcome: Progressing   Problem: Metabolic: Goal: Ability to maintain appropriate glucose levels will improve Outcome: Progressing   Problem: Respiratory: Goal: Will regain and/or maintain adequate ventilation Outcome: Progressing   Problem: Health Behavior/Discharge Planning: Goal: Ability to manage health-related needs will improve Outcome: Progressing   Problem: Activity: Goal: Risk for activity intolerance will decrease Outcome: Progressing

## 2023-09-09 NOTE — Progress Notes (Signed)
 Nutrition Follow-up  DOCUMENTATION CODES:   Obesity unspecified  INTERVENTION:   -Liberalize diet to carb modified for wider variety of meal selections -Continue MVI with minerals daily -Continue Ensure Max po BID, each supplement provides 150 kcal and 30 grams of protein.   -RD referred pt to [facility] for further education, support, and reinforcement  NUTRITION DIAGNOSIS:   Inadequate oral intake related to acute illness as evidenced by per patient/family report.  Ongoing  GOAL:   Patient will meet greater than or equal to 90% of their needs  Progressing   MONITOR:   PO intake, Supplement acceptance, Labs, Weight trends, Skin, I & O's  REASON FOR ASSESSMENT:   Consult Diet education  ASSESSMENT:   68 year old female with history of hypertension, stroke, CHF, obesity, poor dentition and chronic pain who is admitted with AMS, HHS, sepsis, cellulitis and new DM.  Reviewed I/O's: -3.5 L x 24 hours and +2.2 L since admission  UOP: 3.6 L x 24 hours   Pt unavailable at time of visit.   Pt remains with good oral intake. Noted meal completions 100%. Pt drinking Ensure Max supplements.   Per ID notes, plan for 1 gram amoxicillin TID until 09/16/23.   No wt loss since admission.   Per TOC notes, plan for home once medically stable. Pt has given resources to connect with a new PCP.   Medications reviewed and include vitamin C, lasix, miralax , and unasyn .  Labs reviewed: CBGS: 128-170 (inpatient orders for glycemic control are 0-15 units insulin  aspart TID with meals, 0-5 units insulin  aspart daily at bedtime, 13 units insulin  aspart TID with meals, and 35 units insulin  glargine-yfgn daily).    Diet Order:   Diet Order             Diet Carb Modified Fluid consistency: Thin  Diet effective now                   EDUCATION NEEDS:   Education needs have been addressed  Skin:  Skin Assessment: Reviewed RN  Assessment  Last BM:  09/09/23 (type 6)  Height:   Ht Readings from Last 1 Encounters:  09/08/23 5\' 2"  (1.575 m)    Weight:   Wt Readings from Last 1 Encounters:  09/09/23 110.3 kg    Ideal Body Weight:  50 kg  BMI:  Body mass index is 44.48 kg/m.  Estimated Nutritional Needs:   Kcal:  1800-2100kcal/day  Protein:  90-105g/day  Fluid:  1.6-1.8L/day    Herschel Lords, RD, LDN, CDCES Registered Dietitian III Certified Diabetes Care and Education Specialist If unable to reach this RD, please use "RD Inpatient" group chat on secure chat between hours of 8am-4 pm daily

## 2023-09-09 NOTE — Progress Notes (Signed)
 Can do Amoxicillin 1 gram TID until 5/15 instead of Augmentin XR

## 2023-09-09 NOTE — Telephone Encounter (Signed)
 Patient Product/process development scientist completed.    The patient is insured through [employer]. Patient has Medicare and is not eligible for a copay card, but may be able to apply for patient assistance or Medicare RX Payment Plan (Patient Must reach out to their plan, if eligible for payment plan), if available.    Ran test claim for amoxicillin-clavulanate 1000-62.5 mg 12 hr tablet and Product not on Formulary   This test claim was processed through [employer]- copay amounts may vary at other pharmacies due to [employer], or as the patient moves through the different stages of their insurance plan.     Morgan Arab, CPHT Pharmacy Technician III Certified Patient Advocate [facility] Patient Advocate Team Direct Number: [phone]  Fax: [phone]

## 2023-09-09 NOTE — Progress Notes (Signed)
 Occupational Therapy Treatment Patient Details Name: Shelby Conway MRN: [id number] DOB: [birth year] Today's Date: 09/09/2023   History of present illness 68 year old female with history of hypertension, history of stroke, morbid obesity, poor dentition, who presents ED via EMS from home for chief concerns of altered mental status. Admitted for hyperosmolar hyperglycemic state, new dx of diabetes mellitus.   OT comments  Pt seen for OT treatment on this date. Upon arrival to room pt in recliner eager to improve functional mobility. MAXA don/doff bilateral socks in sitting. Pt amb into hallway with CGA + RW, ~146ft, no noted LOB. Pt returned to room to use BR, required pericare assistance. STS from low toilet seat with use of side rails. Pt returned to recliner with all needs in reach. Pt making good progress toward goals, will continue to follow POC. Discharge recommendation remains appropriate.        If plan is discharge home, recommend the following:  A little help with walking and/or transfers;A little help with bathing/dressing/bathroom;Assistance with cooking/housework;Assist for transportation;Help with stairs or ramp for entrance   Equipment Recommendations  BSC/3in1;Tub/shower bench;Other (comment)    Recommendations for Other Services      Precautions / Restrictions Precautions Precautions: Fall Recall of Precautions/Restrictions: Intact Restrictions Weight Bearing Restrictions Per Provider Order: No       Mobility Bed Mobility               General bed mobility comments: NT in recliner pre/post session    Transfers Overall transfer level: Needs assistance Equipment used: Rolling walker (2 wheels) Transfers: Sit to/from Stand, Bed to chair/wheelchair/BSC Sit to Stand: Supervision           General transfer comment: Amb into hallway and BR with RW, supervision for STS, CGA during amb     Balance Overall balance assessment: Needs  assistance Sitting-balance support: Feet supported Sitting balance-Leahy Scale: Good     Standing balance support: Single extremity supported Standing balance-Leahy Scale: Good                             ADL either performed or assessed with clinical judgement   ADL Overall ADL's : Needs assistance/impaired Eating/Feeding: Independent;Sitting                   Lower Body Dressing: Maximal assistance (Sitting in recliner)   Toilet Transfer: Contact guard assist;Ambulation;Regular Toilet;Rolling walker (2 wheels) (Simulated)   Toileting- Clothing Manipulation and Hygiene: Maximal assistance;Sit to/from stand       Functional mobility during ADLs: Contact guard assist;Rolling walker (2 wheels) General ADL Comments: MAXA pericare in standing    Communication Communication Communication: No apparent difficulties   Cognition Arousal: Alert Behavior During Therapy: WFL for tasks assessed/performed Cognition: No apparent impairments             OT - Cognition Comments: A/Ox4                 Following commands: Intact        Cueing   Cueing Techniques: Verbal cues  Exercises Exercises: Other exercises Other Exercises Other Exercises: Edu: Safe transfer with DME management    Shoulder Instructions       General Comments      Pertinent Vitals/ Pain       Pain Assessment Pain Assessment: No/denies pain  Home Living  Prior Functioning/Environment              Frequency  Min 2X/week        Progress Toward Goals  OT Goals(current goals can now be found in the care plan section)  Progress towards OT goals: Progressing toward goals  Acute Rehab OT Goals Patient Stated Goal: get better and go home OT Goal Formulation: With patient Time For Goal Achievement: 09/21/23 Potential to Achieve Goals: Good ADL Goals Pt Will Perform Lower Body Dressing: with modified  independence;sit to/from stand Pt Will Transfer to Toilet: with modified independence;ambulating Pt Will Perform Toileting - Clothing Manipulation and hygiene: with modified independence;sitting/lateral leans;sit to/from stand Additional ADL Goal #1: Pt will complete all aspects of bathing, primarily from sitting with mod indep, AE PRN, 1/1 opportunity. Additional ADL Goal #2: Pt will verbalize plan to implement at least 1 learned falls prevention strategy.  Plan      Co-evaluation                 AM-PAC OT "6 Clicks" Daily Activity     Outcome Measure   Help from another person eating meals?: None Help from another person taking care of personal grooming?: None Help from another person toileting, which includes using toliet, bedpan, or urinal?: A Little Help from another person bathing (including washing, rinsing, drying)?: A Little Help from another person to put on and taking off regular upper body clothing?: None Help from another person to put on and taking off regular lower body clothing?: A Little 6 Click Score: 21    End of Session Equipment Utilized During Treatment: Gait belt;Rolling walker (2 wheels)  OT Visit Diagnosis: Other abnormalities of gait and mobility (R26.89);Muscle weakness (generalized) (M62.81)   Activity Tolerance Patient tolerated treatment well   Patient Left in chair;with call bell/phone within reach;with family/visitor present;with nursing/sitter in room   Nurse Communication Mobility status        Time: 8295-6213 OT Time Calculation (min): 21 min  Charges: OT General Charges $OT Visit: 1 Visit OT Treatments $Therapeutic Activity: 8-22 mins  Rosaria Common M.S. OTR/L  09/09/23, 4:47 PM

## 2023-09-09 NOTE — Anesthesia Postprocedure Evaluation (Signed)
 Anesthesia Post Note  Patient: Shelby Conway  Procedure(s) Performed: ECHOCARDIOGRAM, TRANSESOPHAGEAL  Patient location during evaluation: PACU Anesthesia Type: General Level of consciousness: awake and alert Pain management: pain level controlled Vital Signs Assessment: post-procedure vital signs reviewed and stable Respiratory status: spontaneous breathing, nonlabored ventilation, respiratory function stable and patient connected to nasal cannula oxygen Cardiovascular status: blood pressure returned to baseline and stable Postop Assessment: no apparent nausea or vomiting Anesthetic complications: no   No notable events documented.   Last Vitals:  Vitals:   09/09/23 0402 09/09/23 0736  BP: 139/79 129/73  Pulse: 89 80  Resp: 18 20  Temp: 36.7 C 36.6 C  SpO2: 98% 99%    Last Pain:  Vitals:   09/09/23 0727  TempSrc:   PainSc: 0-No pain                 Zula Hitch

## 2023-09-09 NOTE — Plan of Care (Signed)
  Problem: Education: Goal: Individualized Educational Video(s) Outcome: Progressing   Problem: Fluid Volume: Goal: Ability to maintain a balanced intake and output will improve Outcome: Progressing   Problem: Metabolic: Goal: Ability to maintain appropriate glucose levels will improve Outcome: Progressing   Problem: Skin Integrity: Goal: Risk for impaired skin integrity will decrease Outcome: Progressing

## 2023-09-10 ENCOUNTER — Other Ambulatory Visit: Payer: Self-pay

## 2023-09-10 DIAGNOSIS — E1165 Type 2 diabetes mellitus with hyperglycemia: Secondary | ICD-10-CM | POA: Diagnosis present

## 2023-09-10 DIAGNOSIS — E1100 Type 2 diabetes mellitus with hyperosmolarity without nonketotic hyperglycemic-hyperosmolar coma (NKHHC): Secondary | ICD-10-CM | POA: Diagnosis not present

## 2023-09-10 LAB — BASIC METABOLIC PANEL WITH GFR
Anion gap: 11 (ref 5–15)
BUN: 18 mg/dL (ref 8–23)
CO2: 31 mmol/L (ref 22–32)
Calcium: 9.7 mg/dL (ref 8.9–10.3)
Chloride: 91 mmol/L — ABNORMAL LOW (ref 98–111)
Creatinine, Ser: 0.62 mg/dL (ref 0.44–1.00)
GFR, Estimated: >60 mL/min (ref >60)
Glucose, Bld: 244 mg/dL — ABNORMAL HIGH (ref 70–99)
Potassium: 3.8 mmol/L (ref 3.5–5.1)
Sodium: 133 mmol/L — ABNORMAL LOW (ref 135–145)

## 2023-09-10 LAB — MAGNESIUM: Magnesium: 1.6 mg/dL — ABNORMAL LOW (ref 1.7–2.4)

## 2023-09-10 LAB — GLUCOSE, CAPILLARY
Glucose-Capillary: 220 mg/dL — ABNORMAL HIGH (ref 70–99)
Glucose-Capillary: 229 mg/dL — ABNORMAL HIGH (ref 70–99)
Glucose-Capillary: 303 mg/dL — ABNORMAL HIGH (ref 70–99)

## 2023-09-10 MED ORDER — INSULIN ASPART 100 UNIT/ML FLEXPEN
12.0000 [IU] | PEN_INJECTOR | Freq: Three times a day (TID) | SUBCUTANEOUS | 0 refills | Status: AC
  Filled 2023-09-10: qty 15, 41d supply, fill #0

## 2023-09-10 MED ORDER — LANCET DEVICE MISC
1.0000 | Freq: Three times a day (TID) | 0 refills | Status: AC
Start: 2023-09-10 — End: ?
  Filled 2023-09-10: qty 1, fill #0

## 2023-09-10 MED ORDER — BLOOD GLUCOSE TEST VI STRP
1.0000 | ORAL_STRIP | Freq: Three times a day (TID) | 0 refills | Status: AC
Start: 2023-09-10 — End: ?
  Filled 2023-09-10: qty 100, 34d supply, fill #0

## 2023-09-10 MED ORDER — BLOOD GLUCOSE MONITOR SYSTEM W/DEVICE KIT
1.0000 | PACK | Freq: Three times a day (TID) | 0 refills | Status: AC
Start: 2023-09-10 — End: ?
  Filled 2023-09-10: qty 1, 30d supply, fill #0

## 2023-09-10 MED ORDER — FUROSEMIDE 40 MG PO TABS
40.0000 mg | ORAL_TABLET | Freq: Every day | ORAL | 0 refills | Status: AC
  Filled 2023-09-10: qty 30, 30d supply, fill #0

## 2023-09-10 MED ORDER — AMOXICILLIN 500 MG PO CAPS
1000.0000 mg | ORAL_CAPSULE | Freq: Three times a day (TID) | ORAL | Status: DC
  Administered 2023-09-10: 1000 mg via ORAL
  Filled 2023-09-10 (×2): qty 2

## 2023-09-10 MED ORDER — ACCU-CHEK SOFTCLIX LANCETS MISC
1.0000 | Freq: Three times a day (TID) | 0 refills | Status: AC
  Filled 2023-09-10: qty 100, 34d supply, fill #0

## 2023-09-10 MED ORDER — AMLODIPINE BESYLATE 5 MG PO TABS
5.0000 mg | ORAL_TABLET | Freq: Every day | ORAL | Status: DC
  Administered 2023-09-10: 5 mg via ORAL
  Filled 2023-09-10: qty 1

## 2023-09-10 MED ORDER — INSULIN PEN NEEDLE 32G X 4 MM MISC
1.0000 | Freq: Three times a day (TID) | 0 refills | Status: AC
  Filled 2023-09-10: qty 100, 34d supply, fill #0

## 2023-09-10 MED ORDER — AMOXICILLIN 500 MG PO CAPS
1000.0000 mg | ORAL_CAPSULE | Freq: Three times a day (TID) | ORAL | 0 refills | Status: AC
  Filled 2023-09-10: qty 36, 6d supply, fill #0

## 2023-09-10 MED ORDER — ROSUVASTATIN CALCIUM 10 MG PO TABS: 10.0000 mg | ORAL_TABLET | Freq: Every day | ORAL | Status: DC

## 2023-09-10 MED ORDER — ALLOPURINOL 100 MG PO TABS
300.0000 mg | ORAL_TABLET | Freq: Every day | ORAL | Status: DC
  Administered 2023-09-10: 300 mg via ORAL
  Filled 2023-09-10: qty 3

## 2023-09-10 MED ORDER — BAQSIMI ONE PACK 3 MG/DOSE NA POWD
1.0000 mg | NASAL | 0 refills | Status: AC | PRN
Start: 2023-09-10 — End: ?
  Filled 2023-09-10 (×2): qty 1, 1d supply, fill #0

## 2023-09-10 MED ORDER — INSULIN GLARGINE 100 UNIT/ML SOLOSTAR PEN
35.0000 [IU] | PEN_INJECTOR | Freq: Every day | SUBCUTANEOUS | 0 refills | Status: AC
  Filled 2023-09-10: qty 15, 42d supply, fill #0

## 2023-09-10 MED ORDER — MAGNESIUM SULFATE 2 GM/50ML IV SOLN
2.0000 g | Freq: Once | INTRAVENOUS | Status: AC
  Administered 2023-09-10: 2 g via INTRAVENOUS
  Filled 2023-09-10: qty 50

## 2023-09-10 NOTE — Inpatient Diabetes Management (Addendum)
 Inpatient Diabetes Program Recommendations  AACE/ADA: New Consensus Statement on Inpatient Glycemic Control (2015)  Target Ranges:  Prepandial:   less than 140 mg/dL      Peak postprandial:   less than 180 mg/dL (1-2 hours)      Critically ill patients:  140 - 180 mg/dL    Latest Reference Range & Units 09/09/23 07:37 09/09/23 11:37 09/09/23 16:42 09/09/23 21:53  Glucose-Capillary 70 - 99 mg/dL 161 (H)  15 units Novolog   35 units Semglee  @1007   170 (H)  16 units Novolog   218 (H)  18 units Novolog   277 (H)  3 units Novolog    (H): Data is abnormally high  Latest Reference Range & Units 09/10/23 08:00 09/10/23 11:57  Glucose-Capillary 70 - 99 mg/dL 096 (H)  18 units Novolog   35 units Semglee  229 (H)  (H): Data is abnormally high   Diabetes history: No; new DM2 dx this admission   Current orders: Semglee  35 units daily       Novolog  13 units TID with meals       Novolog  0-15 units TID AC + HS    MD- Please consider increasing the Novolog  Meal Coverage to 15 units TID with meals     Long acting recommendations: Insulin  Glargine (LANTUS ) Solostar Pen 35 units daily  Short acting recommendations:  Meal + Correction coverage Insulin  aspart (NOVOLOG ) FlexPen  Sensitive Scale.  15 units for meal coverage plus correction Supply/Referral recommendations: Glucometer Test strips Lancet device Lancets Pen needles - standard Referral to Nutrition & Diab Services     --Will follow patient during hospitalization--  Langston Pippins RN, MSN, CDCES Diabetes Coordinator Inpatient Glycemic Control Team Team Pager: [phone] (8a-5p)

## 2023-09-10 NOTE — Progress Notes (Signed)
 Pt assisted with mobility in room prior to d/c. Supervision for mobility with properly adjusted RW for home use. No LOB. Assistance for hygiene after toilet use. Pt appears safe for d/c with HHPT.   09/10/23 1700  PT Visit Information  Assistance Needed +1  History of Present Illness 68 year old female with history of hypertension, history of stroke, morbid obesity, poor dentition, who presents ED via EMS from home for chief concerns of altered mental status. Admitted for hyperosmolar hyperglycemic state, new dx of diabetes mellitus.  Subjective Data  Subjective I'm going home soon  Patient Stated Goal to go home  Precautions  Precautions Fall  Recall of Precautions/Restrictions Intact  Restrictions  Weight Bearing Restrictions Per Provider Order No  Pain Assessment  Pain Assessment No/denies pain  Cognition  Arousal Alert  Behavior During Therapy WFL for tasks assessed/performed  PT - Cognitive impairments No apparent impairments  Following Commands  Following commands Intact  Cueing  Cueing Techniques Verbal cues  Communication  Communication No apparent difficulties  Bed Mobility  General bed mobility comments NT in recliner pre/post session  Transfers  Overall transfer level Needs assistance  Equipment used Rolling walker (2 wheels)  Transfers Sit to/from Stand  Sit to Stand Supervision (MinA from low surface)  General transfer comment Amb into hallway and BR with RW, supervision for STS, CGA during amb  Ambulation/Gait  Ambulation/Gait assistance Supervision  Gait Distance (Feet) 35 Feet  Assistive device Rolling walker (2 wheels)  Gait Pattern/deviations Step-through pattern;Trunk flexed;Wide base of support  General Gait Details heavy reliance on RW  Gait velocity dec  Balance  Overall balance assessment Needs assistance  Sitting-balance support Feet supported  Sitting balance-Leahy Scale Good  Standing balance support Single extremity supported  Standing  balance-Leahy Scale Good  Standing balance comment attempted pericare in standing, limited due to back pain  Other Exercises  Other Exercises Edu: Safe transfer with DME management  PT - End of Session  Equipment Utilized During Treatment Gait belt  Activity Tolerance Patient tolerated treatment well  Patient left in chair;with call bell/phone within reach  Nurse Communication Mobility status   PT - Assessment/Plan  PT Visit Diagnosis Other abnormalities of gait and mobility (R26.89);Difficulty in walking, not elsewhere classified (R26.2);Muscle weakness (generalized) (M62.81)  PT Frequency (ACUTE ONLY) Min 1X/week  Follow Up Recommendations Home health PT  Patient can return home with the following Assistance with cooking/housework;Assist for transportation;Help with stairs or ramp for entrance  PT equipment Rolling walker (2 wheels)  AM-PAC PT "6 Clicks" Mobility Outcome Measure (Version 2)  Help needed turning from your back to your side while in a flat bed without using bedrails? 4  Help needed moving from lying on your back to sitting on the side of a flat bed without using bedrails? 4  Help needed moving to and from a bed to a chair (including a wheelchair)? 4  Help needed standing up from a chair using your arms (e.g., wheelchair or bedside chair)? 4  Help needed to walk in hospital room? 4  Help needed climbing 3-5 steps with a railing?  3  6 Click Score 23  Consider Recommendation of Discharge To: Home with no services  Progressive Mobility  What is the highest level of mobility based on the progressive mobility assessment? Level 5 (Walks with assist in room/hall) - Balance while stepping forward/back and can walk in room with assist - Complete  Activity Ambulated with assistance in hallway  PT Goal Progression  Progress towards PT  goals Progressing toward goals  PT Time Calculation  PT Start Time (ACUTE ONLY) 1634  PT Stop Time (ACUTE ONLY) 1650  PT Time Calculation (min)  (ACUTE ONLY) 16 min  PT General Charges  $$ ACUTE PT VISIT 1 Visit  PT Treatments  $Therapeutic Activity 8-22 mins   Melvyn Stagers, PTA 09/10/2023

## 2023-09-10 NOTE — Plan of Care (Signed)
  Problem: Education: Goal: Knowledge of General Education information will improve Description: Including pain rating scale, medication(s)/side effects and non-pharmacologic comfort measures Outcome: Progressing   Problem: Pain Managment: Goal: General experience of comfort will improve and/or be controlled Outcome: Progressing   Problem: Skin Integrity: Goal: Risk for impaired skin integrity will decrease Outcome: Progressing

## 2023-09-10 NOTE — TOC Progression Note (Signed)
 Transition of Care [facility]) - Progression Note    Patient Details  Name: Shelby Conway MRN: [id number] Date of Birth: Jul 16, 1955  [facility] CM/SW Contact  Elsie Halo, RN Phone Number: 09/10/2023, 11:44 AM  Clinical Narrative:     Referral for [facility] PT/OT called to [facility] [phone] who accepted the referral. RW referral sent to [facility] [phone], the request is being sent to be processed and will be delvered to the patient's room TOC will continue to follow.       Expected Discharge Plan and Services                                               Social Determinants of Health (SDOH) Interventions SDOH Screenings   Food Insecurity: No Food Insecurity (09/02/2023)  Housing: Low Risk  (09/05/2023)  Transportation Needs: No Transportation Needs (09/02/2023)  Utilities: Not At Risk (09/02/2023)  Social Connections: Patient Declined (09/05/2023)  Tobacco Use: Medium Risk (09/02/2023)    Readmission Risk Interventions     No data to display

## 2023-09-10 NOTE — Discharge Summary (Signed)
 Physician Discharge Summary   Patient: Shelby Conway MRN: [id number] DOB: [birth year]  Admit date:     09/02/2023  Discharge date: 09/10/23  Discharge Physician: Sheril Dines   PCP: [facility]   Recommendations at discharge:   Follow-up with PCP in 1 week Outpatient follow-up with gastroenterologist within 1 month for evaluation of liver cirrhosis  Discharge Diagnoses: Principal Problem:   Hyperosmolar hyperglycemic state (HHS) (HCC) Active Problems:   Sepsis due to cellulitis (HCC)   Altered mental status   Chronic pain   Essential hypertension   Hyperosmolar hyponatremia   Leukocytosis   Vaginal candidiasis   Morbid obesity (HCC)   At risk for polypharmacy   Bacteremia due to Enterococcus   Candidal intertrigo   Bacteremia   Liver cirrhosis (HCC)   Type 2 diabetes mellitus with hyperglycemia (HCC)  Resolved Problems:   * No resolved hospital problems. *  Hospital Course:   Shelby Conway is a 68 y.o. female with medical history significant for hypertension, hyperlipidemia, obesity, stroke in 2005, CHF, who presented to the hospital because of altered mental status.    Vitals in the ED showed T of 98, respiration rate of 14, heart rate of 113, blood pressure 96/78, improved to 125/65, SpO2 of 93% on 3 L nasal cannula.   Serum sodium is 122, potassium 4.4, chloride 85, bicarb 21, BUN of 26, serum creatinine 1.30, EGFR 45, nonfasting blood glucose 594, WBC 13.7, hemoglobin of 15.8, platelets of 229.   Anion gap was elevated at 16.  Lactic acid was 5.6.  UA was negative for ketones, leukocytes, nitrates.    Assessment and Plan:   Secondary to severe sepsis secondary to polymicrobial bacteremia: Blood culture showed Enterococcus faecalis, Peptostreptococcus assacchorolyticus and actinomyces species.   No evidence of vegetation on TEE. Peptostreptococcus and actinomyces 1/2 culture-these are GI organism.  She has poor dentition and Peptostreptococcus  and actinomyces s can be present in the dental plaques/mouth  CT abdomen pelvis did not show any findings that would account for bacteremia. Continue IV Unasyn .   ID specialist recommended amoxicillin  1 g 3 times daily through 09/16/2023 No growth on repeat blood cultures.     Type II DM with severe hyperglycemia, hyperosmolar hyperglycemic state: Continue Lantus  and NovoLog .  She will be discharged on Lantus  35 units daily and NovoLog  12 units 3 times daily with meals. Close follow-up with PCP strongly recommended for adjustment in  insulin  therapy based on glucose levels. Hemoglobin A1c 15.5 She has been evaluated by the diabetic coordinator.     Severe intertriginous Candida with possible secondary cellulitis: Completed 7 days of fluconazole  on 09/08/2023     Fluid overload: Patient reports history of CHF and used to take Lasix  in the past.  Notably, patient had IV fluids initially for sepsis and HHS. Suspected acute on chronic diastolic CHF. She had good urine output with IV Lasix .  She will be discharged on oral Lasix  40 mg daily.  Lasix  dose may be adjusted in the outpatient setting based on volume status. 2D echo and TEE showed preserved EF. BNP 185.8.     Liver cirrhosis: Unknown if liver cirrhosis is contributing to fluid overload.   CT abdomen pelvis on 09/08/2023 showed hepatic steatosis and findings suggestive of liver cirrhosis.  Of note, chart review showed patient was found to have hepatic steatosis on MRI abdomen done in January 2015. Recommended outpatient follow-up with gastroenterologist for further management.       AKI: Resolved Hyponatremia: Improved  Acute metabolic encephalopathy: Resolved     Her condition has improved and she is deemed stable for discharge to home today.      Consultants: ID specialist, cardiologist Procedures performed: TEE Disposition: Home health Diet recommendation:  Discharge Diet Orders (From admission, onward)     Start      Ordered   09/10/23 0000  Diet - low sodium heart healthy        09/10/23 1300   09/10/23 0000  Diet Carb Modified        09/10/23 1300           Cardiac and Carb modified diet DISCHARGE MEDICATION: Allergies as of 09/10/2023       Reactions   Vicodin [hydrocodone-acetaminophen ]         Medication List     TAKE these medications    albuterol  108 (90 Base) MCG/ACT inhaler Commonly known as: VENTOLIN  HFA Inhale 2 puffs into the lungs every 6 (six) hours as needed for wheezing or shortness of breath.   allopurinol 300 MG tablet Commonly known as: ZYLOPRIM Take 300 mg by mouth daily.   amLODipine 5 MG tablet Commonly known as: NORVASC Take 5 mg by mouth daily.   amoxicillin  500 MG capsule Commonly known as: AMOXIL  Take 2 capsules (1,000 mg total) by mouth 3 (three) times daily for 6 days.   Blood Glucose Monitoring Suppl Devi 1 each by Does not apply route 3 (three) times daily. May dispense any manufacturer covered by patient's insurance.   BLOOD GLUCOSE TEST STRIPS Strp 1 each by Does not apply route 3 (three) times daily. Use as directed to check blood sugar. May dispense any manufacturer covered by patient's insurance and fits patient's device.   furosemide  40 MG tablet Commonly known as: Lasix  Take 1 tablet (40 mg total) by mouth daily.   gabapentin  100 MG capsule Commonly known as: NEURONTIN  Take 100 mg by mouth 4 (four) times daily.   insulin  aspart 100 UNIT/ML FlexPen Commonly known as: NOVOLOG  Inject 12 Units into the skin 3 (three) times daily with meals. Only take if eating a meal AND Blood Glucose (BG) is 80 or higher.   insulin  glargine 100 UNIT/ML Solostar Pen Commonly known as: LANTUS  Inject 35 Units into the skin daily. May substitute as needed per insurance. Start taking on: Sep 11, 2023   Lancet Device Misc 1 each by Does not apply route 3 (three) times daily. May dispense any manufacturer covered by patient's insurance.   Lancets Misc 1  each by Does not apply route 3 (three) times daily. Use as directed to check blood sugar. May dispense any manufacturer covered by patient's insurance and fits patient's device.   oxyCODONE  15 MG immediate release tablet Commonly known as: ROXICODONE  Take 15 mg by mouth every 8 (eight) hours as needed for pain.   Pen Needles 31G X 5 MM Misc 1 each by Does not apply route 3 (three) times daily. May dispense any manufacturer covered by patient's insurance.   rosuvastatin 10 MG tablet Commonly known as: CRESTOR Take 10 mg by mouth at bedtime.               Durable Medical Equipment  (From admission, onward)           Start     Ordered   09/10/23 0000  DME Bedside commode       Question:  Patient needs a bedside commode to treat with the following condition  Answer:  General weakness  09/10/23 1300   09/09/23 0000  For home use only DME Walker rolling       Question Answer Comment  Walker: With 5 Inch Wheels   Patient needs a walker to treat with the following condition General weakness      09/09/23 1345            Follow-up Information     [facility] Follow up.   Why: Hospital follow up Contact information: [address] [place] [address] [phone]         [facility] Follow up.   Specialty: Home Health Services Why: Agency will call to schedule. Contact information: [address] [place] [address] [phone]         [facility] Follow up.   Why: RW will be delivered to the patient's room Contact information: [address] [facility] [place] [address] [phone]                Discharge Exam: Filed Weights   09/08/23 0513 09/08/23 0714 09/09/23 0425  Weight: 109 kg 109 kg 110.3 kg   GEN: NAD SKIN: Warm and dry EYES: No pallor or icterus ENT: MMM CV: RRR PULM: CTA B ABD: soft, obese/distended, NT, +BS CNS: AAO x 3, non focal EXT: Significant bilateral leg edema with  mild erythema but without tenderness   Condition at discharge: good  The results of significant diagnostics from this hospitalization (including imaging, microbiology, ancillary and laboratory) are listed below for reference.   Imaging Studies: CT ABDOMEN PELVIS W CONTRAST Result Date: 09/08/2023 CLINICAL DATA:  Polymicrobial bacteremia including Enterococcus faecalis bacteremia EXAM: CT ABDOMEN AND PELVIS WITH CONTRAST TECHNIQUE: Multidetector CT imaging of the abdomen and pelvis was performed using the standard protocol following bolus administration of intravenous contrast. RADIATION DOSE REDUCTION: This exam was performed according to the departmental dose-optimization program which includes automated exposure control, adjustment of the mA and/or kV according to patient size and/or use of iterative reconstruction technique. CONTRAST:  OMNIPAQUE  IOHEXOL  350 MG/ML SOLN COMPARISON:  None are available FINDINGS: Lower chest: No acute abnormality. Hepatobiliary: Cholecystectomy. Hepatic steatosis. Nodular hepatic contour. No biliary dilation. Pancreas: Fatty atrophy.  No acute abnormality. Spleen: Unremarkable. Adrenals/Urinary Tract: Unremarkable adrenal glands. No urinary calculi or hydronephrosis. Unremarkable bladder. Stomach/Bowel: Normal caliber large and small bowel. No bowel wall thickening. Stomach and appendix are within normal limits. Enteric contrast is present within the stomach and small bowel. Vascular/Lymphatic: Aortic atherosclerosis. No enlarged abdominal or pelvic lymph nodes. Reproductive: Uterus and bilateral adnexa are unremarkable. Other: No free intraperitoneal fluid or air. Fat containing umbilical hernia containing prominent vessels and fat stranding. Body wall edema. Musculoskeletal: Superior endplate compression of L2 is presumed chronic but age indeterminate without recent comparison. Correlate for pain. Otherwise no acute fracture. IMPRESSION: 1. Fat containing umbilical  hernia containing prominent vessels and fat stranding. Correlate for incarceration. 2. Hepatic steatosis. Nodular hepatic contour suggesting cirrhosis. 3. Superior endplate compression of L2 is presumed chronic but age indeterminate without recent comparison. Correlate for pain. 4. Body wall edema. 5. Aortic Atherosclerosis (ICD10-I70.0). Electronically Signed   By: Rozell Cornet M.D.   On: 09/08/2023 19:36   ECHO TEE Result Date: 09/08/2023    TRANSESOPHOGEAL ECHO REPORT   Patient Name:   Shelby Conway Date of Exam: 09/08/2023 Medical Rec #:  [id number]        Height:       62.0 in Accession #:  [id number]       Weight:       240.3 lb Date of Birth:  Dec 07, 1955        BSA:          2.067 m Patient Age:    67 years         BP:           99/43 mmHg Patient Gender: F                HR:           95 bpm. Exam Location:  [facility] Procedure: Transesophageal Echo, Cardiac Doppler, Color Doppler and Saline            Contrast Bubble Study (Both Spectral and Color Flow Doppler were            utilized during procedure). Indications:     Bacteremia R78.81  History:         Patient has prior history of Echocardiogram examinations, most                  recent 09/06/2023. CHF, Stroke; Risk Factors:Hypertension.  Sonographer:     Broadus Canes Referring Phys:  [id number] [facility] Diagnosing Phys: Belva Boyden MD PROCEDURE: After discussion of the risks and benefits of a TEE, an informed consent was obtained from the patient. TEE procedure time was 30 minutes. The transesophogeal probe was passed without difficulty through the esophogus of the patient. Imaged were obtained with the patient in a left lateral decubitus position. Local oropharyngeal anesthetic was provided with viscous lidocaine  and Cetacaine . Sedation performed by different physician. The patient was monitored while under deep sedation. Image quality was excellent. The patient's vital signs; including heart rate, blood pressure, and oxygen saturation; remained  stable throughout the procedure. The patient developed no complications during the procedure.  IMPRESSIONS  1. No valve vegetation noted.  2. Left ventricular ejection fraction, by estimation, is 55 to 60%. The left ventricle has normal function. The left ventricle has no regional wall motion abnormalities. Left ventricular diastolic parameters are indeterminate.  3. Right ventricular systolic function is normal. The right ventricular size is normal.  4. Left atrial size was moderately dilated. No left atrial/left atrial appendage thrombus was detected.  5. The mitral valve is normal in structure. Mild calcification of the anterior leaflet. Mild mitral valve regurgitation. No evidence of mitral stenosis.  6. Tricuspid valve regurgitation is mild to moderate.  7. The aortic valve is normal in structure. There is mild calcification of the aortic valve. Aortic valve regurgitation is not visualized. Aortic valve sclerosis is present, with no evidence of aortic valve stenosis.  8. There is Moderate (Grade III) atheroma plaque involving the aortic arch and descending aorta.  9. The inferior vena cava is normal in size with greater than 50% respiratory variability, suggesting right atrial pressure of 3 mmHg. 10. Agitated saline contrast bubble study was negative, with no evidence of any interatrial shunt. Conclusion(s)/Recommendation(s): Normal biventricular function without evidence of hemodynamically significant valvular heart disease. FINDINGS  Left Ventricle: Left ventricular ejection fraction, by estimation, is 55 to 60%. The left ventricle has normal function. The left ventricle has no regional wall motion abnormalities. The left ventricular internal cavity size was normal in size. There is  no left ventricular hypertrophy. Left ventricular diastolic parameters are indeterminate. Right Ventricle: The right ventricular size is normal. No increase in right ventricular wall thickness. Right ventricular systolic function  is normal. Left Atrium: Left  atrial size was moderately dilated. No left atrial/left atrial appendage thrombus was detected. Right Atrium: Right atrial size was normal in size. Pericardium: There is no evidence of pericardial effusion. Mitral Valve: The mitral valve is normal in structure. There is mild calcification of the mitral valve leaflet(s). Mild mitral valve regurgitation. No evidence of mitral valve stenosis. There is no evidence of mitral valve vegetation. Tricuspid Valve: The tricuspid valve is normal in structure. Tricuspid valve regurgitation is mild to moderate. No evidence of tricuspid stenosis. There is no evidence of tricuspid valve vegetation. Aortic Valve: The aortic valve is normal in structure. There is mild calcification of the aortic valve. Aortic valve regurgitation is not visualized. Aortic valve sclerosis is present, with no evidence of aortic valve stenosis. There is no evidence of aortic valve vegetation. Pulmonic Valve: The pulmonic valve was normal in structure. Pulmonic valve regurgitation is not visualized. No evidence of pulmonic stenosis. There is no evidence of pulmonic valve vegetation. Aorta: The aortic root is normal in size and structure. There is moderate (Grade III) atheroma plaque involving the aortic arch and descending aorta. Venous: The inferior vena cava is normal in size with greater than 50% respiratory variability, suggesting right atrial pressure of 3 mmHg. IAS/Shunts: No atrial level shunt detected by color flow Doppler. Agitated saline contrast was given intravenously to evaluate for intracardiac shunting. Agitated saline contrast bubble study was negative, with no evidence of any interatrial shunt. There  is no evidence of a patent foramen ovale. There is no evidence of an atrial septal defect. Additional Comments: 3D was performed not requiring image post processing on an independent workstation and was indeterminate. Belva Boyden MD Electronically signed by  Belva Boyden MD Signature Date/Time: 09/08/2023/10:46:28 AM    Final    ECHOCARDIOGRAM COMPLETE Result Date: 09/06/2023    ECHOCARDIOGRAM REPORT   Patient Name:   Shelby Conway Date of Exam: 09/06/2023 Medical Rec #:  [id number]        Height:       62.0 in Accession #:    [id number]       Weight:       222.2 lb Date of Birth:  1956-01-04        BSA:          1.999 m Patient Age:    67 years         BP:           111/80 mmHg Patient Gender: F                HR:           98 bpm. Exam Location:  [facility] Procedure: 2D Echo, Cardiac Doppler and Color Doppler (Both Spectral and Color            Flow Doppler were utilized during procedure). Indications:     Bacteremia R78.81  History:         Patient has no prior history of Echocardiogram examinations.                  CHF, Stroke; Risk Factors:Hypertension.  Sonographer:     Broadus Canes Referring Phys:  [id number] Alica Inks Diagnosing Phys: Lida Reeks Alluri IMPRESSIONS  1. Technically difficult study.  2. Left ventricular ejection fraction, by estimation, is 50 to 55%. The left ventricle has low normal function. The left ventricle has no regional wall motion abnormalities. There is mild left ventricular hypertrophy. Left ventricular diastolic parameters are indeterminate.  3. Right ventricular systolic  function is normal. The right ventricular size is normal.  4. Left atrial size was mildly dilated.  5. Echogenic structure on mitral valve- can be degenerative change related to mitral annular calcification, cannot rule out vegetation. Consider TEE for further evaluation as clinically indicated. Trivial mitral valve regurgitation. Moderate mitral annular calcification.  6. The aortic valve was not well visualized. Aortic valve regurgitation is not visualized. No aortic stenosis is present.  7. The inferior vena cava is normal in size with greater than 50% respiratory variability, suggesting right atrial pressure of 3 mmHg. FINDINGS  Left Ventricle: Left ventricular  ejection fraction, by estimation, is 50 to 55%. The left ventricle has low normal function. The left ventricle has no regional wall motion abnormalities. The left ventricular internal cavity size was normal in size. There is mild left ventricular hypertrophy. Left ventricular diastolic parameters are indeterminate. Right Ventricle: The right ventricular size is normal. No increase in right ventricular wall thickness. Right ventricular systolic function is normal. Left Atrium: Left atrial size was mildly dilated. Right Atrium: Right atrial size was normal in size. Pericardium: There is no evidence of pericardial effusion. Mitral Valve: Echogenic structure on mitral valve- can be degenerative change related to mitral annular calcification, cannot rule out vegetation. Consider TEE for further evaluation as clinically indicated. There is mild thickening of the mitral valve leaflet(s). Moderate mitral annular calcification. Trivial mitral valve regurgitation. Tricuspid Valve: The tricuspid valve is not well visualized. Tricuspid valve regurgitation is trivial. Aortic Valve: The aortic valve was not well visualized. Aortic valve regurgitation is not visualized. No aortic stenosis is present. Aortic valve mean gradient measures 7.0 mmHg. Aortic valve peak gradient measures 9.7 mmHg. Aortic valve area, by VTI measures 2.49 cm. Pulmonic Valve: The pulmonic valve was not well visualized. Pulmonic valve regurgitation is not visualized. Aorta: The aortic root is normal in size and structure. Venous: The inferior vena cava is normal in size with greater than 50% respiratory variability, suggesting right atrial pressure of 3 mmHg. IAS/Shunts: The interatrial septum was not well visualized.  LEFT VENTRICLE PLAX 2D LVIDd:         3.10 cm LVIDs:         2.30 cm LV PW:         0.90 cm LV IVS:        1.30 cm LVOT diam:     2.00 cm LV SV:         64 LV SV Index:   32 LVOT Area:     3.14 cm  RIGHT VENTRICLE RV Basal diam:  3.10 cm RV  Mid diam:    2.50 cm RV S prime:     12.60 cm/s TAPSE (M-mode): 1.9 cm LEFT ATRIUM             Index        RIGHT ATRIUM           Index LA diam:        3.70 cm 1.85 cm/m   RA Area:     12.60 cm LA Vol (A2C):   58.6 ml 29.31 ml/m  RA Volume:   28.70 ml  14.36 ml/m LA Vol (A4C):   79.7 ml 39.87 ml/m LA Biplane Vol: 70.9 ml 35.46 ml/m  AORTIC VALVE AV Area (Vmax):    2.78 cm AV Area (Vmean):   2.52 cm AV Area (VTI):     2.49 cm AV Vmax:           156.00 cm/s AV  Vmean:          120.000 cm/s AV VTI:            0.259 m AV Peak Grad:      9.7 mmHg AV Mean Grad:      7.0 mmHg LVOT Vmax:         138.00 cm/s LVOT Vmean:        96.400 cm/s LVOT VTI:          0.205 m LVOT/AV VTI ratio: 0.79  AORTA Ao Root diam: 2.20 cm MITRAL VALVE                TRICUSPID VALVE MV Area (PHT): 3.33 cm     TR Peak grad:   14.7 mmHg MV Decel Time: 228 msec     TR Vmax:        192.00 cm/s MV E velocity: 132.00 cm/s                             SHUNTS                             Systemic VTI:  0.20 m                             Systemic Diam: 2.00 cm Joetta Mustache Electronically signed by Joetta Mustache Signature Date/Time: 09/06/2023/5:01:06 PM    Final    DG Chest Port 1 View Result Date: 09/02/2023 CLINICAL DATA:  Altered mental status. EXAM: PORTABLE CHEST 1 VIEW COMPARISON:  January 28, 2020. FINDINGS: Stable cardiomediastinal silhouette. Both lungs are clear. The visualized skeletal structures are unremarkable. IMPRESSION: No active disease. Electronically Signed   By: Rosalene Colon M.D.   On: 09/02/2023 14:02    Microbiology: Results for orders placed or performed during the hospital encounter of 09/02/23  Blood Culture (routine x 2)     Status: Abnormal   Collection Time: 09/02/23 11:45 AM   Specimen: BLOOD  Result Value Ref Range Status   Specimen Description   Final    BLOOD BLOOD LEFT HAND Performed at [facility], [address] [place], [place] [address]    Special Requests   Final    BOTTLES  DRAWN AEROBIC AND ANAEROBIC Blood Culture results may not be optimal due to an inadequate volume of blood received in culture bottles Performed at [facility], [address] [place], [place] [address]    Culture  Setup Time   Final    GRAM POSITIVE COCCI AEROBIC BOTTLE ONLY CRITICAL RESULT CALLED TO, READ BACK BY AND VERIFIED WITH: NATHAN BELEUE @0258  ON 09/03/23 [facility] ANAEROBIC BOTTLE ONLY GRAM POSITIVE RODS CRITICAL RESULT CALLED TO, READ BACK BY AND VERIFIED WITH: TREY GREENWOOD AT 1002 09/04/23.PMF    Culture (A)  Final    ENTEROCOCCUS FAECALIS PEPTOSTREPTOCOCCUS ASACCHAROLYTICUS ACTINOMYCES SPECIES Standardized susceptibility testing for this organism is not available. Performed at [facility], [address], [place], [place] [address]    Report Status 09/07/2023 FINAL  Final   Organism ID, Bacteria ENTEROCOCCUS FAECALIS  Final      Susceptibility   Enterococcus faecalis - MIC*    AMPICILLIN  <=2 SENSITIVE Sensitive     VANCOMYCIN  2 SENSITIVE Sensitive     GENTAMICIN SYNERGY SENSITIVE Sensitive     * ENTEROCOCCUS FAECALIS  Blood Culture ID Panel (  Reflexed)     Status: Abnormal   Collection Time: 09/02/23 11:45 AM  Result Value Ref Range Status   Enterococcus faecalis DETECTED (A) NOT DETECTED Final    Comment: CRITICAL RESULT CALLED TO, READ BACK BY AND VERIFIED WITH: NATHAN BELEUE @0258  ON 09/03/23 [facility]    Enterococcus Faecium NOT DETECTED NOT DETECTED Final   Listeria monocytogenes NOT DETECTED NOT DETECTED Final   Staphylococcus species NOT DETECTED NOT DETECTED Final   Staphylococcus aureus (BCID) NOT DETECTED NOT DETECTED Final   Staphylococcus epidermidis NOT DETECTED NOT DETECTED Final   Staphylococcus lugdunensis NOT DETECTED NOT DETECTED Final   Streptococcus species NOT DETECTED NOT DETECTED Final   Streptococcus agalactiae NOT DETECTED NOT DETECTED Final   Streptococcus pneumoniae NOT DETECTED NOT DETECTED Final   Streptococcus pyogenes NOT  DETECTED NOT DETECTED Final   A.calcoaceticus-baumannii NOT DETECTED NOT DETECTED Final   Bacteroides fragilis NOT DETECTED NOT DETECTED Final   Enterobacterales NOT DETECTED NOT DETECTED Final   Enterobacter cloacae complex NOT DETECTED NOT DETECTED Final   Escherichia coli NOT DETECTED NOT DETECTED Final   Klebsiella aerogenes NOT DETECTED NOT DETECTED Final   Klebsiella oxytoca NOT DETECTED NOT DETECTED Final   Klebsiella pneumoniae NOT DETECTED NOT DETECTED Final   Proteus species NOT DETECTED NOT DETECTED Final   Salmonella species NOT DETECTED NOT DETECTED Final   Serratia marcescens NOT DETECTED NOT DETECTED Final   Haemophilus influenzae NOT DETECTED NOT DETECTED Final   Neisseria meningitidis NOT DETECTED NOT DETECTED Final   Pseudomonas aeruginosa NOT DETECTED NOT DETECTED Final   Stenotrophomonas maltophilia NOT DETECTED NOT DETECTED Final   Candida albicans NOT DETECTED NOT DETECTED Final   Candida auris NOT DETECTED NOT DETECTED Final   Candida glabrata NOT DETECTED NOT DETECTED Final   Candida krusei NOT DETECTED NOT DETECTED Final   Candida parapsilosis NOT DETECTED NOT DETECTED Final   Candida tropicalis NOT DETECTED NOT DETECTED Final   Cryptococcus neoformans/gattii NOT DETECTED NOT DETECTED Final   Vancomycin  resistance NOT DETECTED NOT DETECTED Final    Comment: Performed at [facility], [address] [place], [place] [address]  Resp panel by RT-PCR (RSV, Flu A&B, Covid) Anterior Nasal Swab     Status: None   Collection Time: 09/02/23 12:17 PM   Specimen: Anterior Nasal Swab  Result Value Ref Range Status   SARS Coronavirus 2 by RT PCR NEGATIVE NEGATIVE Final    Comment: (NOTE) SARS-CoV-2 target nucleic acids are NOT DETECTED.  The SARS-CoV-2 RNA is generally detectable in upper respiratory specimens during the acute phase of infection. The lowest concentration of SARS-CoV-2 viral copies this assay can detect is 138 copies/mL. A negative result  does not preclude SARS-Cov-2 infection and should not be used as the sole basis for treatment or other patient management decisions. A negative result may occur with  improper specimen collection/handling, submission of specimen other than nasopharyngeal swab, presence of viral mutation(s) within the areas targeted by this assay, and inadequate number of viral copies(<138 copies/mL). A negative result must be combined with clinical observations, patient history, and epidemiological information. The expected result is Negative.  Fact Sheet for Patients:  BloggerCourse.com  Fact Sheet for Healthcare Providers:  SeriousBroker.it  This test is no t yet approved or cleared by the [place]  FDA and  has been authorized for detection and/or diagnosis of SARS-CoV-2 by FDA under an Emergency Use Authorization (EUA). This EUA will remain  in effect (meaning this test can be used) for the duration of  the COVID-19 declaration under Section 564(b)(1) of the Act, 21 U.S.C.section 360bbb-3(b)(1), unless the authorization is terminated  or revoked sooner.       Influenza A by PCR NEGATIVE NEGATIVE Final   Influenza B by PCR NEGATIVE NEGATIVE Final    Comment: (NOTE) The Xpert Xpress SARS-CoV-2/FLU/RSV plus assay is intended as an aid in the diagnosis of influenza from Nasopharyngeal swab specimens and should not be used as a sole basis for treatment. Nasal washings and aspirates are unacceptable for Xpert Xpress SARS-CoV-2/FLU/RSV testing.  Fact Sheet for Patients: BloggerCourse.com  Fact Sheet for Healthcare Providers: SeriousBroker.it  This test is not yet approved or cleared by the [place]  FDA and has been authorized for detection and/or diagnosis of SARS-CoV-2 by FDA under an Emergency Use Authorization (EUA). This EUA will remain in effect (meaning this test can be used) for  the duration of the COVID-19 declaration under Section 564(b)(1) of the Act, 21 U.S.C. section 360bbb-3(b)(1), unless the authorization is terminated or revoked.     Resp Syncytial Virus by PCR NEGATIVE NEGATIVE Final    Comment: (NOTE) Fact Sheet for Patients: BloggerCourse.com  Fact Sheet for Healthcare Providers: SeriousBroker.it  This test is not yet approved or cleared by the [place]  FDA and has been authorized for detection and/or diagnosis of SARS-CoV-2 by FDA under an Emergency Use Authorization (EUA). This EUA will remain in effect (meaning this test can be used) for the duration of the COVID-19 declaration under Section 564(b)(1) of the Act, 21 U.S.C. section 360bbb-3(b)(1), unless the authorization is terminated or revoked.  Performed at [facility], [address] [place], [place] [address]   Blood Culture (routine x 2)     Status: None   Collection Time: 09/02/23 12:17 PM   Specimen: BLOOD  Result Value Ref Range Status   Specimen Description BLOOD BLOOD RIGHT FOREARM  Final   Special Requests   Final    BOTTLES DRAWN AEROBIC AND ANAEROBIC Blood Culture adequate volume   Culture   Final    NO GROWTH 5 DAYS Performed at [facility], [address] [place], [place] [address]    Report Status 09/07/2023 FINAL  Final  MRSA Next Gen by PCR, Nasal     Status: None   Collection Time: 09/02/23  6:58 PM   Specimen: Nasal Mucosa; Nasal Swab  Result Value Ref Range Status   MRSA by PCR Next Gen NOT DETECTED NOT DETECTED Final    Comment: (NOTE) The GeneXpert MRSA Assay (FDA approved for NASAL specimens only), is one component of a comprehensive MRSA colonization surveillance program. It is not intended to diagnose MRSA infection nor to guide or monitor treatment for MRSA infections. Test performance is not FDA approved in patients less than 55 years old. Performed at [facility],  [address] [place], [place] [address]   Culture, blood (Routine X 2) w Reflex to ID Panel     Status: None   Collection Time: 09/03/23 11:33 PM   Specimen: BLOOD  Result Value Ref Range Status   Specimen Description BLOOD RIGHT ARM  Final   Special Requests   Final    BOTTLES DRAWN AEROBIC AND ANAEROBIC Blood Culture adequate volume   Culture   Final    NO GROWTH 5 DAYS Performed at [facility], [address], [place], [place] [address]    Report Status 09/08/2023 FINAL  Final  Culture, blood (Routine X 2) w Reflex to ID Panel  Status: None   Collection Time: 09/03/23 11:41 PM   Specimen: BLOOD  Result Value Ref Range Status   Specimen Description BLOOD LEFT ARM  Final   Special Requests   Final    BOTTLES DRAWN AEROBIC ONLY Blood Culture results may not be optimal due to an inadequate volume of blood received in culture bottles   Culture   Final    NO GROWTH 5 DAYS Performed at [facility], [address] [place], [place] [address]    Report Status 09/08/2023 FINAL  Final    Labs: CBC: Recent Labs  Lab 09/04/23 0319 09/05/23 0309 09/06/23 0424 09/07/23 0551 09/08/23 0425  WBC 8.1 7.0 6.2 6.5 5.9  HGB 13.2 12.4 13.7 13.1 12.4  HCT 38.9 36.7 41.1 39.4 36.7  MCV 89.0 89.1 90.3 90.0 91.3  PLT 111* 117* 125* 142* 132*   Basic Metabolic Panel: Recent Labs  Lab 09/04/23 0319 09/05/23 0309 09/06/23 0424 09/07/23 0551 09/08/23 0425 09/09/23 0545 09/10/23 0418  NA 129* 129* 133* 129* 132* 136 133*  K 3.8 4.1 3.8 4.2 4.3 4.2 3.8  CL 96* 94* 95* 94* 96* 96* 91*  CO2 25 26 29 26 28 30 31   GLUCOSE 244* 277* 195* 235* 153* 125* 244*  BUN 11 15 15 19 15 12 18   CREATININE 0.56 0.59 0.57 0.63 0.71 0.58 0.62  CALCIUM 8.2* 8.2* 8.7* 9.0 9.2 9.5 9.7  MG 1.8 1.7 1.6*  --  1.6*  --  1.6*  PHOS 2.1* 2.7 2.4*  --  3.7  --   --    Liver Function Tests: No results for input(s): "AST", "ALT", "ALKPHOS", "BILITOT", "PROT", "ALBUMIN" in the last  168 hours. CBG: Recent Labs  Lab 09/09/23 1137 09/09/23 1642 09/09/23 2153 09/10/23 0800 09/10/23 1157  GLUCAP 170* 218* 277* 220* 229*    Discharge time spent: greater than 30 minutes.  Signed: Sheril Dines, MD [facility] 09/10/2023

## 2023-09-10 NOTE — Progress Notes (Signed)
 Mobility Specialist - Progress Note    09/10/23 1226  Mobility  Activity Ambulated with assistance to bathroom;Stood at bedside  Level of Assistance Independent after set-up  Assistive Device Front wheel walker  Distance Ambulated (ft) 15 ft  Range of Motion/Exercises Active  Activity Response Tolerated well  Mobility Referral Yes  Mobility visit 1 Mobility  Mobility Specialist Start Time (ACUTE ONLY) 1154  Mobility Specialist Stop Time (ACUTE ONLY) 1226  Mobility Specialist Time Calculation (min) (ACUTE ONLY) 32 min   Pt resting in chair upon entry on RA. Pt STS and ambulates to bathroom and performs hygiene care Indep with RW. Pt returned to chair for lunch and left with needs in reach.   Jerri Morale Mobility Specialist 09/10/23, 12:27 PM

## 2023-09-11 NOTE — TOC CM/SW Note (Signed)
 Call from [facility] with [facility] - she states she received a referral for the patient and is trying to figure out if the patient was referred to [facility] or [facility].
